# Patient Record
Sex: Male | Born: 1965
Health system: Southern US, Community
[De-identification: ages and names within clinical notes are randomized; demographics above are authoritative.]

## PROBLEM LIST (undated history)

## (undated) DIAGNOSIS — E785 Hyperlipidemia, unspecified: Secondary | ICD-10-CM

## (undated) DIAGNOSIS — H9190 Unspecified hearing loss, unspecified ear: Secondary | ICD-10-CM

## (undated) DIAGNOSIS — I10 Essential (primary) hypertension: Secondary | ICD-10-CM

## (undated) HISTORY — DX: Essential (primary) hypertension: I10

## (undated) HISTORY — DX: Unspecified hearing loss, unspecified ear: H91.90

## (undated) HISTORY — PX: KNEE CARTILAGE SURGERY: SHX688

## (undated) HISTORY — DX: Hyperlipidemia, unspecified: E78.5

---

## 2016-12-17 ENCOUNTER — Encounter: Payer: Self-pay | Admitting: Pediatrics

## 2016-12-17 ENCOUNTER — Encounter (INDEPENDENT_AMBULATORY_CARE_PROVIDER_SITE_OTHER): Payer: Self-pay

## 2016-12-17 ENCOUNTER — Ambulatory Visit (INDEPENDENT_AMBULATORY_CARE_PROVIDER_SITE_OTHER): Payer: PRIVATE HEALTH INSURANCE | Admitting: Pediatrics

## 2016-12-17 VITALS — BP 156/104 | HR 92 | Temp 97.9°F | Ht 73.0 in | Wt 227.2 lb

## 2016-12-17 DIAGNOSIS — Z13220 Encounter for screening for lipoid disorders: Secondary | ICD-10-CM | POA: Diagnosis not present

## 2016-12-17 DIAGNOSIS — Z125 Encounter for screening for malignant neoplasm of prostate: Secondary | ICD-10-CM | POA: Diagnosis not present

## 2016-12-17 DIAGNOSIS — I10 Essential (primary) hypertension: Secondary | ICD-10-CM | POA: Diagnosis not present

## 2016-12-17 DIAGNOSIS — Z6829 Body mass index (BMI) 29.0-29.9, adult: Secondary | ICD-10-CM | POA: Diagnosis not present

## 2016-12-17 DIAGNOSIS — Z7289 Other problems related to lifestyle: Secondary | ICD-10-CM | POA: Insufficient documentation

## 2016-12-17 DIAGNOSIS — Z789 Other specified health status: Secondary | ICD-10-CM

## 2016-12-17 DIAGNOSIS — Z6828 Body mass index (BMI) 28.0-28.9, adult: Secondary | ICD-10-CM

## 2016-12-17 DIAGNOSIS — E669 Obesity, unspecified: Secondary | ICD-10-CM | POA: Insufficient documentation

## 2016-12-17 MED ORDER — LISINOPRIL 10 MG PO TABS: 10.0000 mg | ORAL_TABLET | Freq: Every day | ORAL | 3 refills | Status: DC

## 2016-12-17 NOTE — Progress Notes (Signed)
  Subjective:   Patient ID: Jacob Wood, male    DOB: [birth year], 51 y.o.   MRN: [id number] CC: New Patient (Initial Visit) and Ear Pain (Left)  HPI: Jacob Wood is a 51 y.o. male presenting for New Patient (Initial Visit) and Ear Pain (Left)  Past month has had whooshing in his L ear Feeling well  Normal appetite No CP, SOB, lightheadedness Work has been stressful past month Walks about 5 miles a day working Otherwise no aerobic exercise Drinks 12 pack of beer every night Starts after work Last time he quit alcohol completley was apprx 10 yrs ago Says he has gone a week or so without alcohol here and there without withdrawal Denies any h/o withdrawal symptoms  Dad had prostate cancer in late 29s he thinks Pt concerned abou this risk of cancer  No fam hx of heart attacks, strokes, colon ca   PMH: none  Fam hx: prostate ca in dad  Social History   Social History  . Marital status: Married    Spouse name: N/A  . Number of children: N/A  . Years of education: N/A   Social History Main Topics  . Smoking status: Never Smoker  . Smokeless tobacco: Never Used  . Alcohol use 50.4 oz/week    84 Cans of beer per week  . Drug use: No  . Sexual activity: Not Asked   Other Topics Concern  . None   Social History Narrative  . None   ROS: All systems negative other than what is in HPI  Objective:    BP (!) 156/104   Pulse 92   Temp 97.9 F (36.6 C) (Oral)   Ht 6' 1" (1.854 m)   Wt 227 lb 3.2 oz (103.1 kg)   BMI 29.98 kg/m   Wt Readings from Last 3 Encounters:  12/17/16 227 lb 3.2 oz (103.1 kg)    Gen: NAD, alert, cooperative with exam, NCAT EYES: EOMI, no conjunctival injection, or no icterus ENT:  TMs pearly gray b/l, OP without erythema LYMPH: no cervical LAD CV: NRRR, normal S1/S2, no murmur, distal pulses 2+ b/l Resp: CTABL, no wheezes, normal WOB Abd: +BS, soft, NTND. no guarding or organomegaly Ext: No edema, warm Neuro: Alert and  oriented  Assessment & Plan:  Jacob Wood was seen today for new patient (initial visit) and ear pain.  Diagnoses and all orders for this visit:  Essential hypertension Elevated Start below, check labs Check BPs at home Decrease salt intake, increase veg intake RTC 4 weeks If ear sounds getting worse let m eknow -     CMP14+EGFR -     lisinopril (PRINIVIL,ZESTRIL) 10 MG tablet; Take 1 tablet (10 mg total) by mouth daily.  Screening for prostate cancer Dad with prostate ca -     PSA, total and free  Screening for hyperlipidemia -     Lipid panel  BMI 29.0-29.9,adult Discussed lifestyle changes, decrease beer intake -     CBC with Differential/Platelet  Alcohol use No history of withdrawal Drinking a 12pk a day of beer Counseled on decreasing  Follow up plan: Return in about 4 weeks (around 01/14/2017). Assunta Found, MD [place]

## 2016-12-18 LAB — CBC WITH DIFFERENTIAL/PLATELET
BASOS ABS: 0 10*3/uL (ref 0.0–0.2)
Basos: 0 %
EOS (ABSOLUTE): 0.1 10*3/uL (ref 0.0–0.4)
Eos: 2 %
Hematocrit: 41.2 % (ref 37.5–51.0)
Hemoglobin: 14 g/dL (ref 13.0–17.7)
IMMATURE GRANS (ABS): 0 10*3/uL (ref 0.0–0.1)
IMMATURE GRANULOCYTES: 0 %
LYMPHS: 23 %
Lymphocytes Absolute: 1.7 10*3/uL (ref 0.7–3.1)
MCH: 31.1 pg (ref 26.6–33.0)
MCHC: 34 g/dL (ref 31.5–35.7)
MCV: 92 fL (ref 79–97)
MONOS ABS: 0.5 10*3/uL (ref 0.1–0.9)
Monocytes: 7 %
NEUTROS PCT: 68 %
Neutrophils Absolute: 5.1 10*3/uL (ref 1.4–7.0)
PLATELETS: 187 10*3/uL (ref 150–379)
RBC: 4.5 x10E6/uL (ref 4.14–5.80)
RDW: 14.1 % (ref 12.3–15.4)
WBC: 7.5 10*3/uL (ref 3.4–10.8)

## 2016-12-18 LAB — CMP14+EGFR
A/G RATIO: 1.9 (ref 1.2–2.2)
ALK PHOS: 62 IU/L (ref 39–117)
ALT: 29 IU/L (ref 0–44)
AST: 23 IU/L (ref 0–40)
Albumin: 4.9 g/dL (ref 3.5–5.5)
BILIRUBIN TOTAL: 0.3 mg/dL (ref 0.0–1.2)
BUN/Creatinine Ratio: 20 (ref 9–20)
BUN: 17 mg/dL (ref 6–24)
CHLORIDE: 100 mmol/L (ref 96–106)
CO2: 22 mmol/L (ref 18–29)
Calcium: 9.4 mg/dL (ref 8.7–10.2)
Creatinine, Ser: 0.87 mg/dL (ref 0.76–1.27)
GFR calc Af Amer: 118 mL/min/{1.73_m2} (ref >59)
GFR calc non Af Amer: 102 mL/min/{1.73_m2} (ref >59)
Globulin, Total: 2.6 g/dL (ref 1.5–4.5)
Glucose: 97 mg/dL (ref 65–99)
POTASSIUM: 4.5 mmol/L (ref 3.5–5.2)
Sodium: 140 mmol/L (ref 134–144)
Total Protein: 7.5 g/dL (ref 6.0–8.5)

## 2016-12-18 LAB — PSA, TOTAL AND FREE
PROSTATE SPECIFIC AG, SERUM: 0.7 ng/mL (ref 0.0–4.0)
PSA FREE PCT: 25.7 %
PSA, Free: 0.18 ng/mL

## 2016-12-18 LAB — LIPID PANEL
Chol/HDL Ratio: 5.4 ratio units — ABNORMAL HIGH (ref 0.0–5.0)
Cholesterol, Total: 282 mg/dL — ABNORMAL HIGH (ref 100–199)
HDL: 52 mg/dL (ref >39)
LDL CALC: 182 mg/dL — AB (ref 0–99)
TRIGLYCERIDES: 241 mg/dL — AB (ref 0–149)
VLDL CHOLESTEROL CAL: 48 mg/dL — AB (ref 5–40)

## 2016-12-28 ENCOUNTER — Encounter: Payer: Self-pay | Admitting: *Deleted

## 2016-12-30 ENCOUNTER — Other Ambulatory Visit: Payer: Self-pay | Admitting: Pediatrics

## 2016-12-30 DIAGNOSIS — E785 Hyperlipidemia, unspecified: Secondary | ICD-10-CM

## 2016-12-30 MED ORDER — PRAVASTATIN SODIUM 40 MG PO TABS: 40.0000 mg | ORAL_TABLET | Freq: Every day | ORAL | 5 refills | Status: DC

## 2017-01-17 ENCOUNTER — Ambulatory Visit: Payer: PRIVATE HEALTH INSURANCE | Admitting: Pediatrics

## 2017-01-19 ENCOUNTER — Ambulatory Visit (INDEPENDENT_AMBULATORY_CARE_PROVIDER_SITE_OTHER): Payer: PRIVATE HEALTH INSURANCE | Admitting: Pediatrics

## 2017-01-19 ENCOUNTER — Encounter: Payer: Self-pay | Admitting: Pediatrics

## 2017-01-19 VITALS — BP 135/86 | HR 75 | Temp 98.1°F | Ht 73.0 in | Wt 225.4 lb

## 2017-01-19 DIAGNOSIS — Z7289 Other problems related to lifestyle: Secondary | ICD-10-CM

## 2017-01-19 DIAGNOSIS — H9312 Tinnitus, left ear: Secondary | ICD-10-CM | POA: Diagnosis not present

## 2017-01-19 DIAGNOSIS — Z6829 Body mass index (BMI) 29.0-29.9, adult: Secondary | ICD-10-CM | POA: Diagnosis not present

## 2017-01-19 DIAGNOSIS — I10 Essential (primary) hypertension: Secondary | ICD-10-CM

## 2017-01-19 DIAGNOSIS — Z789 Other specified health status: Secondary | ICD-10-CM | POA: Diagnosis not present

## 2017-01-19 NOTE — Patient Instructions (Signed)
Goal blood pressures 120s/70s

## 2017-01-19 NOTE — Progress Notes (Signed)
  Subjective:   Patient ID: Jacob Wood, male    DOB: 1966/05/21, 51 y.o.   MRN: [id number] CC: Follow-up and Trouble hearing (Left ear)  HPI: Jacob Wood is a 51 y.o. male presenting for Follow-up and Trouble hearing (Left ear)  hasnt been checking BP at home Down to 24 beers a week now, was drinking 12 beers a day Riding mountain bike 30 minutes every day Running stairs at work Has been enjoying change in lifestyle  Continues to have constant whooshing sound in L ear  Relevant past medical, surgical, family and social history reviewed. Allergies and medications reviewed and updated. History  Smoking Status  . Never Smoker  Smokeless Tobacco  . Never Used   ROS: Per HPI   Objective:    BP 135/86   Pulse 75   Temp 98.1 F (36.7 C) (Oral)   Ht 6\' 1"  (1.854 m)   Wt 225 lb 6.4 oz (102.2 kg)   BMI 29.74 kg/m   Wt Readings from Last 3 Encounters:  01/19/17 225 lb 6.4 oz (102.2 kg)  12/17/16 227 lb 3.2 oz (103.1 kg)    Gen: NAD, alert, cooperative with exam, NCAT EYES: EOMI, no conjunctival injection, or no icterus ENT:  TMs pearly gray b/l, OP without erythema LYMPH: no cervical LAD CV: NRRR, normal S1/S2, no murmur, distal pulses 2+ b/l Resp: CTABL, no wheezes, normal WOB Abd: +BS, soft, NTND. no guarding or organomegaly Ext: No edema, warm Neuro: Alert and oriented  Assessment & Plan:  Jacob Wood was seen today for follow-up and trouble hearing.  Diagnoses and all orders for this visit:  Tinnitus of left ear Ongoing symptoms -     Ambulatory referral to ENT  Essential hypertension Improved from last visit Not on medications Changing lifestyle habits  Alcohol use Decreasing EtOH use  Elevated BMI Exercising more regularly, decreasing EtOH, trying to eat more healthy foods  Follow up plan: Return in about 3 months (around 04/21/2017). Assunta Found, MD [place]

## 2017-02-14 DIAGNOSIS — H905 Unspecified sensorineural hearing loss: Secondary | ICD-10-CM | POA: Insufficient documentation

## 2017-02-16 ENCOUNTER — Other Ambulatory Visit: Payer: Self-pay | Admitting: Otolaryngology

## 2017-02-16 DIAGNOSIS — H9042 Sensorineural hearing loss, unilateral, left ear, with unrestricted hearing on the contralateral side: Secondary | ICD-10-CM

## 2017-02-16 DIAGNOSIS — H93A2 Pulsatile tinnitus, left ear: Secondary | ICD-10-CM

## 2017-03-04 ENCOUNTER — Other Ambulatory Visit: Payer: Self-pay

## 2017-03-04 ENCOUNTER — Ambulatory Visit
Admission: RE | Admit: 2017-03-04 | Discharge: 2017-03-04 | Disposition: A | Discharge status: Home / Self Care | Payer: PRIVATE HEALTH INSURANCE | Source: Ambulatory Visit | Attending: Otolaryngology | Admitting: Otolaryngology

## 2017-03-04 DIAGNOSIS — H93A2 Pulsatile tinnitus, left ear: Secondary | ICD-10-CM

## 2017-03-04 DIAGNOSIS — H9042 Sensorineural hearing loss, unilateral, left ear, with unrestricted hearing on the contralateral side: Secondary | ICD-10-CM

## 2017-03-04 MED ORDER — GADOBENATE DIMEGLUMINE 529 MG/ML IV SOLN
20.0000 mL | Freq: Once | INTRAVENOUS | Status: AC | PRN
  Administered 2017-03-04: 20 mL via INTRAVENOUS

## 2017-03-14 ENCOUNTER — Ambulatory Visit: Payer: PRIVATE HEALTH INSURANCE | Admitting: Family Medicine

## 2017-03-15 ENCOUNTER — Encounter: Payer: Self-pay | Admitting: Family Medicine

## 2017-03-15 ENCOUNTER — Ambulatory Visit (INDEPENDENT_AMBULATORY_CARE_PROVIDER_SITE_OTHER): Payer: PRIVATE HEALTH INSURANCE | Admitting: Family Medicine

## 2017-03-15 VITALS — BP 137/93 | HR 74 | Temp 97.8°F | Ht 73.0 in | Wt 222.8 lb

## 2017-03-15 DIAGNOSIS — S8990XA Unspecified injury of unspecified lower leg, initial encounter: Secondary | ICD-10-CM | POA: Diagnosis not present

## 2017-03-15 NOTE — Patient Instructions (Signed)
Great to see you!  We will work on a referral to sports medicine.   For now use 2 aleve twice daily ( no more than 2 weeks straight), Ice, and compression with an ace bandage.

## 2017-03-15 NOTE — Progress Notes (Signed)
   HPI  Patient presents today with left calf pain.  Patient explains that 4 days ago he was walking a chair back from his yard going into the garage when he felt like "a hammer hit him in the back of the calf". He states that he had something like a muscle spasm at the time that relaxed after several hours of heat and massage. The following day he continued to have the same sort of muscle spasm.  Today he continues to have similar pain. He's been treating it with an Ace bandage and heat.  He's had a difficult time working Monday morning due to difficulty and pain with walking.  PMH: Smoking status noted ROS: Per HPI  Objective: BP (!) 137/93   Pulse 74   Temp 97.8 F (36.6 C) (Oral)   Ht 6\' 1"  (1.854 m)   Wt 222 lb 12.8 oz (101.1 kg)   BMI 29.39 kg/m  Gen: NAD, alert, cooperative with exam HEENT: NCAT CV: RRR, good S1/S2, no murmur Resp: CTABL, no wheezes, non-labored Ext: No edema, warm Neuro: Alert and oriented, No gross deficits MSK:  tenderness to palpation on the medial belly of the gastroc/soleus on the left Achilles tendon feels to be intact, flexion of the left foot with full strength  Assessment and plan:  # Calf injury Considering his injury and persistent pain after 4 days I am concerned about at least a partial muscle tear or tendon injury to the soleus or medial gastroc Considering difficulty walking and working have referred him to sports medicine to consider muscles that'll ultrasound to see if discrete injury can be identified. His tendon feels to be intact but don't believe he will need surgical correction. Conservative therapy with scheduled NSAIDs, ice, compression 2-3 days out of work.     Orders Placed This Encounter  Procedures  . Ambulatory referral to Sports Medicine    Referral Priority:   Routine    Referral Type:   Consultation    Number of Visits Requested:   [place], MD [place] 03/15/2017, 8:37  AM

## 2017-03-16 ENCOUNTER — Ambulatory Visit (INDEPENDENT_AMBULATORY_CARE_PROVIDER_SITE_OTHER): Payer: PRIVATE HEALTH INSURANCE | Admitting: Family Medicine

## 2017-03-16 ENCOUNTER — Encounter: Payer: Self-pay | Admitting: Family Medicine

## 2017-03-16 DIAGNOSIS — M79662 Pain in left lower leg: Secondary | ICD-10-CM | POA: Diagnosis not present

## 2017-03-16 MED ORDER — MELOXICAM 15 MG PO TABS: 15.0000 mg | ORAL_TABLET | Freq: Every day | ORAL | 0 refills | Status: DC

## 2017-03-16 NOTE — Progress Notes (Signed)
  Jacob Wood - 51 y.o. male MRN [id number]  Date of birth: [birth year]  SUBJECTIVE:  Including CC & ROS.   Jacob Wood is a 51 year old male that is presenting with left calf pain. He was getting something out of his garage and went to turn and carry the chair out of the garage and felt a pop in his calf. He had instant swelling. The pain has been moderate and continued since the injury occurred. He has tried heat and ice. He has pain on the medial gastroc. He has taken Aleve. He denies to prior injury.  ROS: No unexpected weight loss, fever, chills,  instability, numbness/tingling, redness, otherwise see HPI   HISTORY: Past Medical, Surgical, Social, and Family History Reviewed & Updated per EMR.   Pertinent Historical Findings include: PMHx: None Surgical:   Left knee arthroscopy Social:  Uses Chewing tobacco FHx: None  DATA REVIEWED: None  PHYSICAL EXAM:  VS: BP 129/88   Ht 6\' 1"  (1.854 m)   Wt 222 lb (100.7 kg)   BMI 29.29 kg/m  PHYSICAL EXAM: Gen: NAD, alert, cooperative with exam, well-appearing HEENT: clear conjunctiva, EOMI CV:  no edema, capillary refill brisk,  Resp: non-labored, normal speech Skin: no rashes, normal turgor  Neuro: no gross deficits.  Psych:  alert and oriented MSK: Left lower leg: Obvious swelling of the leg. No obvious defect of the Achilles Pain exacerbated with plantarflexion. Tenderness to palpation over the medial gastroc. Some tenderness to palpation lateral gastroc. No pain with dorsiflexion. Normal knee flexion and extension. Plantarflexion with [place] Silos test Neurovascular intact.  Limited ultrasound: left leg:  Achilles intact and normal Large hematoma of the medial gastroc just superior to the soleus that extends from the musculotendinous junction to the proximal head of the medial gastrocnemius. Lateral gastrocnemius and intersection were normal  Summary: Findings are consistent with a large medial gastrocnemius tear and  hematoma development  Ultrasound and interpretation by Clearance Coots, MD   ASSESSMENT & PLAN:   Pain of left calf He has a severe tear of medial gastrocnemius. Large hematoma development on ultrasound. - Ace wrap and Cam Walker provided today. - Mobic sent. If doesn't control his pain may need to send in tramadol. - He will follow-up in one week to monitor the size of the hematoma with ultrasound and also to monitor for any development of a blood clot.

## 2017-03-17 DIAGNOSIS — M79662 Pain in left lower leg: Secondary | ICD-10-CM | POA: Insufficient documentation

## 2017-03-17 NOTE — Assessment & Plan Note (Addendum)
He has a severe tear of medial gastrocnemius. Large hematoma development on ultrasound. - Ace wrap and Cam Walker provided today. - Mobic sent. If doesn't control his pain may need to send in tramadol. - He will follow-up in one week to monitor the size of the hematoma with ultrasound and also to monitor for any development of a blood clot.

## 2017-03-23 ENCOUNTER — Encounter: Payer: Self-pay | Admitting: Student

## 2017-03-23 ENCOUNTER — Ambulatory Visit (INDEPENDENT_AMBULATORY_CARE_PROVIDER_SITE_OTHER): Payer: PRIVATE HEALTH INSURANCE | Admitting: Student

## 2017-03-23 VITALS — BP 142/86 | Ht 73.0 in | Wt 222.0 lb

## 2017-03-23 DIAGNOSIS — M79662 Pain in left lower leg: Secondary | ICD-10-CM

## 2017-03-23 NOTE — Assessment & Plan Note (Signed)
Pain and swelling much improvement is seen on ultrasound. Gave calf sleeve to help with pain. Recommend wearing a boot while walking long distances. Follow-up in 2 weeks to reassess and potentially start increasing exercises.

## 2017-03-23 NOTE — Progress Notes (Signed)
  Jacob Wood - 51 y.o. male MRN [id number]  Date of birth: 07-24-1966  SUBJECTIVE:  Including CC & ROS.  CC: left calf pain  Presents with 1 week follow-up of a left medial gastroc tear. This was seen on ultrasound last week. He was placed in a boot to help with weightbearing. 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 He reports much improved pain and motion. He has been doing light ROM exercises with improvement of pain.   ROS: No unexpected weight loss, fever, chills, swelling, instability, muscle pain, numbness/tingling, redness, otherwise see HPI   PMHx - Updated and reviewed.  Contributory factors include: HLD PSHx - Updated and reviewed.  Contributory factors include:  Negative FHx - Updated and reviewed.  Contributory factors include:  Negative Social Hx - Updated and reviewed. Contributory factors include: Negative Medications - reviewed   DATA REVIEWED: Previous u/s last week- medial gastroc tear  PHYSICAL EXAM:  VS: BP:(!) 142/86  HR: bpm  TEMP: ( )  RESP:   HT:6\' 1"  (185.4 cm)   WT:222 lb (100.7 kg)  BMI:29.4 PHYSICAL EXAM: Gen: NAD, alert, cooperative with exam, well-appearing HEENT: clear conjunctiva,  CV:  no edema, capillary refill brisk, normal rate Resp: non-labored Skin: no rashes, normal turgor  Neuro: no gross deficits.  Psych:  alert and oriented  Ankle & Foot: No visible ecchymosis, erythema, ulcers, calluses, blister.  Mild swelling of left calf compared to right Achilles tendon without nodules or tenderness No swelling of retrocalcaneal bursa Mildly decreased in plantarflexion, dorsiflexion, inversion, and eversion of the foot; flexion and extension of the toes Sensation: intact Vascular: intact w/ dorsalis pedis & posterior tibialis pulses 2+  Limited ultrasound of left calf:  Left Achilles without any tearing and has good fibrillary pattern Left retrocalcaneal bursitis Medial gastrocnemius with hematoma superior to soleus at the  musculotendinous junction, smaller than last visit  Summary: Findings consistent with an improved medial gastrocnemius tear and hematoma development  Ultrasound performed and interpreted by Melton Krebs, DO   ASSESSMENT & PLAN:   Pain of left calf Pain and swelling much improvement is seen on ultrasound. Gave calf sleeve to help with pain. Recommend wearing a boot while walking long distances. Follow-up in 2 weeks to reassess and potentially start increasing exercises.

## 2017-04-06 ENCOUNTER — Ambulatory Visit (INDEPENDENT_AMBULATORY_CARE_PROVIDER_SITE_OTHER): Payer: PRIVATE HEALTH INSURANCE | Admitting: Student

## 2017-04-06 VITALS — BP 128/80

## 2017-04-06 DIAGNOSIS — M79662 Pain in left lower leg: Secondary | ICD-10-CM | POA: Diagnosis not present

## 2017-04-06 NOTE — Progress Notes (Signed)
  Jacob Wood - 51 y.o. male MRN [id number]  Date of birth: [birth year]  SUBJECTIVE:  Including CC & ROS.  CC: left calf pain  Follows up for tear of the medial gastrocnemius muscle on his left side. He is doing much better since his last visit 2 weeks ago. He had done a partial tear to this muscle while changing directions while running.  He has been doing gentle range of motion exercises with much improvement. He has even been able to bike without pain. He did have a mild amount of pain with jogging up stairs. He tried these exercises yesterday. He has used his calf sleeve and has had much relief with this.   ROS: No unexpected weight loss, fever, chills, swelling, instability, muscle pain, numbness/tingling, redness, otherwise see HPI   PMHx - Updated and reviewed.  Contributory factors include: HTN, alcohol use PSHx - Updated and reviewed.  Contributory factors include:  Negative FHx - Updated and reviewed.  Contributory factors include:  Negative Social Hx - Updated and reviewed. Contributory factors include: Negative Medications - reviewed   DATA REVIEWED: Previous office visits  PHYSICAL EXAM:  VS: BP:128/80  HR: bpm  TEMP: ( )  RESP:   HT:    WT:   BMI:  PHYSICAL EXAM: Gen: NAD, alert, cooperative with exam, well-appearing HEENT: clear conjunctiva,  CV:  no edema, capillary refill brisk, normal rate Resp: non-labored Skin: no rashes, normal turgor  Neuro: no gross deficits.  Psych:  alert and oriented  Isolation of left calf reveals no tenderness palpation. On inspection no visible increase in size of the left calf compared to the right. Full muscle strength of his bilateral lower extremities Neurovascularly intact distally in his bilateral lower extremities  ASSESSMENT & PLAN:   Pain of left calf Much improved from last visit. Recommend strengthening exercises. Gave him a sheet of exercises to do, including toe and heel walks. Also gave ankle strengthening  exercises with a band. Follow-up as needed. Recommended walking up and down stairs at first for 2 weeks. After that he can start jogging for 2 weeks. After that time he can start running. Did counsel that straining the calf can seem as though it is healed 1 not early in the healing process. Recommended a slow return to activity.

## 2017-04-06 NOTE — Assessment & Plan Note (Signed)
Much improved from last visit. Recommend strengthening exercises. Gave him a sheet of exercises to do, including toe and heel walks. Also gave ankle strengthening exercises with a band. Follow-up as needed. Recommended walking up and down stairs at first for 2 weeks. After that he can start jogging for 2 weeks. After that time he can start running. Did counsel that straining the calf can seem as though it is healed 1 not early in the healing process. Recommended a slow return to activity.

## 2017-04-22 ENCOUNTER — Encounter: Payer: Self-pay | Admitting: Pediatrics

## 2017-04-22 ENCOUNTER — Ambulatory Visit (INDEPENDENT_AMBULATORY_CARE_PROVIDER_SITE_OTHER): Payer: PRIVATE HEALTH INSURANCE | Admitting: Pediatrics

## 2017-04-22 VITALS — BP 132/85 | HR 85 | Temp 98.2°F | Ht 73.0 in | Wt 218.2 lb

## 2017-04-22 DIAGNOSIS — I10 Essential (primary) hypertension: Secondary | ICD-10-CM

## 2017-04-22 DIAGNOSIS — H9312 Tinnitus, left ear: Secondary | ICD-10-CM | POA: Diagnosis not present

## 2017-04-22 DIAGNOSIS — E785 Hyperlipidemia, unspecified: Secondary | ICD-10-CM

## 2017-04-22 DIAGNOSIS — Z789 Other specified health status: Secondary | ICD-10-CM

## 2017-04-22 DIAGNOSIS — Z7289 Other problems related to lifestyle: Secondary | ICD-10-CM

## 2017-04-22 MED ORDER — PRAVASTATIN SODIUM 40 MG PO TABS: 40.0000 mg | ORAL_TABLET | Freq: Every day | ORAL | 2 refills | Status: DC

## 2017-04-22 NOTE — Progress Notes (Signed)
  Subjective:   Patient ID: Jacob Wood, male    DOB: [birth year], 51 y.o.   MRN: [id number] CC: Follow-up multiple med problems HPI: Jacob Wood is a 51 y.o. male presenting for Follow-up  Ear symptoms with whooshing sound the same Doesn't come and go There all the time Has been seen by ENT, normal MRI Has f/u with them next month  EtOH use: drinking 12 pack Friday and Saturday Down from 12 pack daily Open to cutting back more  Had a tear of gastrocnemius muscle about a month ago Has been cleared to start increasing activity which he is looking forward to Plans to return to biking/other activities slowly  HTN: no HA, no cp with exertion Never started BP medicine prescribed BP now improved, has had apprx 10 lbs weight loss [facility]   HLD: taking pravastatin daily, no s/e  Relevant past medical, surgical, family and social history reviewed. Allergies and medications reviewed and updated. History  Smoking Status  . Never Smoker  Smokeless Tobacco  . Never Used   ROS: Per HPI   Objective:    BP 132/85   Pulse 85   Temp 98.2 F (36.8 C) (Oral)   Ht 6\' 1"  (1.854 m)   Wt 218 lb 3.2 oz (99 kg)   BMI 28.79 kg/m   Wt Readings from Last 3 Encounters:  04/22/17 218 lb 3.2 oz (99 kg)  03/23/17 222 lb (100.7 kg)  03/16/17 222 lb (100.7 kg)    Gen: NAD, alert, cooperative with exam, NCAT EYES: EOMI, no conjunctival injection, or no icterus ENT:  TMs gray with nl LR b/l, OP without erythema LYMPH: no cervical LAD CV: NRRR, normal S1/S2, no murmur Resp: CTABL, no wheezes, normal WOB Abd: +BS, soft, NTND. no guarding or organomegaly Ext: No edema, warm Neuro: Alert and oriented, strength equal b/l UE and LE, coordination grossly normal  Assessment & Plan:  Jacob Wood was seen today for follow-up.  Diagnoses and all orders for this visit:  Alcohol use Decreasing amount, open to decreasing more No withdrawal symptoms  Hyperlipidemia, unspecified  hyperlipidemia type Cont below -     pravastatin (PRAVACHOL) 40 MG tablet; Take 1 tablet (40 mg total) by mouth daily.  Essential hypertension BP improved, not on meds now, has had apprx 10 lbs weight loss, cont lifestyle changes [facility], let m eknow if regularly elevated, will need to start med  Tinnitus of left ear Normal MRI with ENT, has f/u appt next week  Follow up plan: Return in about 6 months (around 10/22/2017). Assunta Found, MD [place]

## 2017-07-22 ENCOUNTER — Encounter: Payer: Self-pay | Admitting: *Deleted

## 2017-08-08 ENCOUNTER — Encounter: Payer: Self-pay | Admitting: Family Medicine

## 2017-08-08 ENCOUNTER — Ambulatory Visit (INDEPENDENT_AMBULATORY_CARE_PROVIDER_SITE_OTHER): Payer: PRIVATE HEALTH INSURANCE | Admitting: Family Medicine

## 2017-08-08 VITALS — BP 143/99 | HR 99 | Temp 98.4°F | Ht 73.0 in | Wt 216.0 lb

## 2017-08-08 DIAGNOSIS — I10 Essential (primary) hypertension: Secondary | ICD-10-CM | POA: Diagnosis not present

## 2017-08-08 DIAGNOSIS — M25512 Pain in left shoulder: Secondary | ICD-10-CM

## 2017-08-08 MED ORDER — KETOROLAC TROMETHAMINE 30 MG/ML IJ SOLN
30.0000 mg | Freq: Once | INTRAMUSCULAR | Status: AC
  Administered 2017-08-08: 30 mg via INTRAMUSCULAR

## 2017-08-08 MED ORDER — MELOXICAM 15 MG PO TABS
15.0000 mg | ORAL_TABLET | Freq: Every day | ORAL | 0 refills | Status: DC
Start: 2017-08-08 — End: 2017-10-20

## 2017-08-08 NOTE — Patient Instructions (Signed)
I think that you likely have something going on in your rotator cuff. I have placed a referral to sports [place] for ultrasound of your shoulder. You were given a dose of Toradol today. This should help with pain and inflammation. I have also prescribed meloxicam 15 mg free to take daily with a meal. Do not take any Motrin/ ibuprofen, naproxen/ Aleve while taking this medicine. You may start the meloxicam tomorrow.   Rotator Cuff Tendinitis Rotator cuff tendinitis is inflammation of the tough, cord-like bands that connect muscle to bone (tendons) in the rotator cuff. The rotator cuff includes all of the muscles and tendons that connect the arm to the shoulder. The rotator cuff holds the head of the upper arm bone (humerus) in the cup (fossa) of the shoulder blade (scapula). This condition can lead to a long-lasting (chronic) tear. The tear may be partial or complete. What are the causes? This condition is usually caused by overusing the rotator cuff. What increases the risk? This condition is more likely to develop in athletes and workers who frequently use their shoulder or reach over their heads. This can include activities such as:  Tennis.  Baseball or softball.  Swimming.  Construction work.  Painting.  What are the signs or symptoms? Symptoms of this condition include:  Pain spreading (radiating) from the shoulder to the upper arm.  Swelling and tenderness in front of the shoulder.  Pain when reaching, pulling, or lifting the arm above the head.  Pain when lowering the arm from above the head.  Minor pain in the shoulder when resting.  Increased pain in the shoulder at night.  Difficulty placing the arm behind the back.  How is this diagnosed? This condition is diagnosed with a medical history and physical exam. Tests may also be done, including:  X-rays.  MRI.  Ultrasounds.  CT or MR arthrogram. During this test, a contrast material is injected and then  images are taken.  How is this treated? Treatment for this condition depends on the severity of the condition. In less severe cases, treatment may include:  Rest. This may be done with a sling that holds the shoulder still (immobilization). Your health care provider may also recommend avoiding activities that involve lifting your arm over your head.  Icing the shoulder.  Anti-inflammatory medicines, such as aspirin or ibuprofen.  In more severe cases, treatment may include:  Physical therapy.  Steroid injections.  Surgery.  Follow these instructions at home: If you have a sling:  Wear the sling as told by your health care provider. Remove it only as told by your health care provider.  Loosen the sling if your fingers tingle, become numb, or turn cold and blue.  Keep the sling clean.  If the sling is not waterproof, do not let it get wet. Remove it, if allowed, or cover it with a watertight covering when you take a bath or shower. Managing pain, stiffness, and swelling  If directed, put ice on the injured area. ? If you have a removable sling, remove it as told by your health care provider. ? Put ice in a plastic bag. ? Place a towel between your skin and the bag. ? Leave the ice on for 20 minutes, 2-3 times a day.  Move your fingers often to avoid stiffness and to lessen swelling.  Raise (elevate) the injured area above the level of your heart while you are lying down.  Find a comfortable sleeping position or sleep on a  recliner, if available. Driving  Do not drive or use heavy machinery while taking prescription pain medicine.  Ask your health care provider when it is safe to drive if you have a sling on your arm. Activity  Rest your shoulder as told by your health care provider.  Return to your normal activities as told by your health care provider. Ask your health care provider what activities are safe for you.  Do any exercises or stretches as told by your  health care provider.  If you do repetitive overhead tasks, take small breaks in between and include stretching exercises as told by your health care provider. General instructions  Do not use any products that contain nicotine or tobacco, such as cigarettes and e-cigarettes. These can delay healing. If you need help quitting, ask your health care provider.  Take over-the-counter and prescription medicines only as told by your health care provider.  Keep all follow-up visits as told by your health care provider. This is important. Contact a health care provider if:  Your pain gets worse.  You have new pain in your arm, hands, or fingers.  Your pain is not relieved with medicine or does not get better after 6 weeks of treatment.  You have cracking sensations when moving your shoulder in certain directions.  You hear a snapping sound after using your shoulder, followed by severe pain and weakness. Get help right away if:  Your arm, hand, or fingers are numb or tingling.  Your arm, hand, or fingers are swollen or painful or they turn white or blue. Summary  Rotator cuff tendinitis is inflammation of the tough, cord-like bands that connect muscle to bone (tendons) in the rotator cuff.  This condition is usually caused by overusing the rotator cuff, which includes all of the muscles and tendons that connect the arm to the shoulder.  This condition is more likely to develop in athletes and workers who frequently use their shoulder or reach over their heads.  Treatment generally includes rest, anti-inflammatory medicines, and icing. In some cases, physical therapy and steroid injections may be needed. In severe cases, surgery may be needed. This information is not intended to replace advice given to you by your health care provider. Make sure you discuss any questions you have with your health care provider. Document Released: 01/08/2004 Document Revised: 10/04/2016 Document Reviewed:  10/04/2016 Elsevier Interactive Patient Education  2017 [employer].

## 2017-08-08 NOTE — Progress Notes (Signed)
Subjective: Jacob Wood pain PCP: Eustaquio Maize, MD MVH:QIONGEX Jacob Wood is a 51 y.o. male presenting to clinic today for:  1. Shoulder pain Patient reports acute onset of left shoulder pain on Friday. He reports that initially started to feel sore and stiff. As of yesterday, he was unable to elevate left upper extremity secondary to pain. He reports that he use Aleve without any improvement. His wife had some Tylenol 3 at home which he notes helps pain for about 30 minutes. He is also been applying topical BenGay, using heat and ice to the shoulder. He denies history of shoulder injury or surgeries. He reports that he is very physically active at work, often lifting, pushing and pulling furniture and other items. Denies swelling, discoloration, weakness, numbness/ tingling.  2. Elevated BP Patient notes he has a history of hypertension but is not on medications for this. He has been using Aleve as above. Denies chest pain, shortness of breath, lower extremity swelling.  No Known Allergies Past Medical History:  Diagnosis Date  . Hyperlipidemia   . Hypertension    History reviewed. No pertinent family history. Social Hx: never smoker.Current medications reviewed.    Health Maintenance: Flu Vaccine needed: yes  ROS: Per HPI  Objective: Office vital signs reviewed. BP (!) 143/99   Pulse 99   Temp 98.4 F (36.9 C) (Oral)   Ht 6\' 1"  (1.854 m)   Wt 216 lb (98 kg)   BMI 28.50 kg/m   Physical Examination:  General: Awake, alert, well nourished, No acute distress HEENT: Normal    Eyes: PERRLA, extraocular membranes intact, sclera white    Throat: moist mucus membranes, no erythema, Cardio: regular rate and rhythm, S1S2 heard, no murmurs appreciated Pulm: clear to auscultation bilaterally, no wheezes, rhonchi or rales; normal work of breathing on room air Ext: Warm, well perfused. Has full active range of motion of the right upper extremity.  Left shoulder: He is not able to  aBduct the upper extremity beyond about 20 during active range of motion. I am able to aBduct the shoulder to about 90 and passive range of motion. He has pain beyond this.He is unable to do internal or external active range of motion testing.  No palpable bony abnormalities. He does have tenderness to palpation to the posterior shoulder. He has pain with empty can testing. He has 4/5 strength with resistance to internal rotation and abduction. Neuro: Light touch sensation intact. Follows commands without difficulty.   Assessment/ Plan: 51 y.o. male   1. Acute pain of left shoulder I suspect rotator cuff tendinopathy. There was no acute trauma or inciting event suggest tear. However, I have placed a referral to sports medicine for evaluation under ultrasound. He was given a dose of Toradol in office. I also prescribed meloxicam 15 mg to start daily tomorrow. I advised him to continue this medication for the next 7-10 days then use as needed. Pending evaluation under ultrasound, could consider steroid injection to the left shoulder if clinically appropriate. No red flags on exam. Patient to follow-up as needed here in office. Return precautions were reviewed. - meloxicam (MOBIC) 15 MG tablet; Take 1 tablet (15 mg total) by mouth daily.  Dispense: 30 tablet; Refill: 0 - ketorolac (TORADOL) 30 MG/ML injection 30 mg; Inject 1 mL (30 mg total) into the muscle once. - Ambulatory referral to Sports Medicine  2. Essential hypertension Family diet controlled. I suspect that elevation of blood pressure may be secondary to acute pain in the  left shoulder. However, I advised him to follow-up in the next 2 weeks with his PCP for recheck of blood pressure. If persistently elevated, would initiate oral antihypertensive. No red flags on exam. Follow up as scheduled.   Orders Placed This Encounter  Procedures  . Ambulatory referral to Sports Medicine    Referral Priority:   Routine    Referral Type:    Consultation    Number of Visits Requested:   1   Meds ordered this encounter  Medications  . meloxicam (MOBIC) 15 MG tablet    Sig: Take 1 tablet (15 mg total) by mouth daily.    Dispense:  30 tablet    Refill:  0  . ketorolac (TORADOL) 30 MG/ML injection 30 mg     Janora Norlander, DO [place] [phone]

## 2017-08-16 ENCOUNTER — Ambulatory Visit: Payer: Self-pay

## 2017-08-16 ENCOUNTER — Ambulatory Visit (INDEPENDENT_AMBULATORY_CARE_PROVIDER_SITE_OTHER): Payer: PRIVATE HEALTH INSURANCE | Admitting: Family Medicine

## 2017-08-16 VITALS — BP 140/100 | Ht 73.0 in | Wt 214.0 lb

## 2017-08-16 DIAGNOSIS — M25512 Pain in left shoulder: Secondary | ICD-10-CM | POA: Insufficient documentation

## 2017-08-16 MED ORDER — TRAMADOL HCL 50 MG PO TABS: ORAL_TABLET | ORAL | 0 refills | Status: DC

## 2017-08-16 MED ORDER — METHYLPREDNISOLONE ACETATE 40 MG/ML IJ SUSP
40.0000 mg | Freq: Once | INTRAMUSCULAR | Status: AC
  Administered 2017-08-16: 40 mg via INTRA_ARTICULAR

## 2017-08-16 NOTE — Assessment & Plan Note (Signed)
Unclear what started this. I suspect he has subacromial bursitis. After consultation today we decided to do subacromial bursa injection and follow him up in one to 2 weeks. I did give him small number of tramadol to take at night for pain as she's not been sleeping well secondary to shoulder issues. He is cautioned on use with alcohol and not to use when driving.

## 2017-08-16 NOTE — Progress Notes (Signed)
  Jacob Wood - 51 y.o. male MRN [id number]  Date of birth: Aug 24, 1966    SUBJECTIVE:      Chief Complaint:/ HPI:  Acute onset of left shoulder pain 7 days ago. Had no unusual activity that day or the day before. He works as an Glass blower/designer. He occasionally does more physical work but nothing specific in recent history that would injure his shoulder. Shoulder pain diffuse, 7 out of 10. Worse at night when she's having difficulty sleeping secondary to shoulder pain. He is right-hand dominant. Has occasional tendon tingling in his left long and ring finger but this is only intermittently. Has trouble reaching forward, reaching overhead and to the side area shoulder hurts at rest.   ROS:     No unusual weight change. No fever. Has noted no swelling or erythema of the left shoulder joint. No other unusual arthralgias or myalgias. No skin rash. No fatigue.  PERTINENT  PMH / PSH FH / / SH:  Past Medical, Surgical, Social, and Family History Reviewed & Updated in the EMR.  Pertinent findings include:  HTN History of hyperlipidemia Has not had a surgeries or any specific injuries to his left shoulder area. Never smoker. Drinks several beers per week.   OBJECTIVE: Ht 6\' 1"  (1.854 m)   Wt 214 lb (97.1 kg)   BMI 28.23 kg/m   Physical Exam:  Vital signs are reviewed. GEN.: Well-developed male no acute distress SHOULDERS: Symmetrical. Right shoulder has full range of motion and normal strength in all planes the rotator cuff. Left shoulder has full passive range of motion to 164 flexion 150 and lateral abduction. Internal rotation limited to the can place his hand to the left buttock. He cannot perform lift off test. NEURO: He has intact sensation soft touch bilateral hands.  EXT: He has intact muscle strength C4-5 and 6 bilateral upper extremities.   ULTRASOUND: Left shoulder: Rotator cuff muscles are all seen and intact. There are some small amount of calcifications in the infraspinatus  muscle. The acromioclavicular joint has minimal arthritic changes. There is no significant effusion, no synovial hypertrophy. The bicep tendon is intact and well positioned.  PROCEDURE: INJECTION: Patient was given informed consent, signed copy in the chart. Appropriate time out was taken. Area prepped and draped in usual sterile fashion. Ethyl chloride was  used for local anesthesia. A 21 gauge 1 1/2 inch needle was used.. 1 cc of methylprednisolone 40 mg/ml plus  4 cc of 1% lidocaine without epinephrine was injected into the left subacromial bursa using a(n) posterior approach.   The patient tolerated the procedure well. There were no complications. Post procedure instructions were given.   ASSESSMENT & PLAN:  See problem based charting & AVS for pt instructions.

## 2017-08-16 NOTE — Patient Instructions (Signed)
See me in  A couple of weeks for follow up

## 2017-08-30 ENCOUNTER — Ambulatory Visit (INDEPENDENT_AMBULATORY_CARE_PROVIDER_SITE_OTHER): Payer: PRIVATE HEALTH INSURANCE | Admitting: Family Medicine

## 2017-08-30 DIAGNOSIS — M25512 Pain in left shoulder: Secondary | ICD-10-CM

## 2017-08-30 MED ORDER — PREDNISONE 50 MG PO TABS: ORAL_TABLET | ORAL | 0 refills | Status: DC

## 2017-08-30 MED ORDER — TRAMADOL HCL 50 MG PO TABS: ORAL_TABLET | ORAL | 0 refills | Status: DC

## 2017-08-30 NOTE — Assessment & Plan Note (Signed)
We had discussion about options. He still paying off his last MRI so he doesn't want to pursue that if at all possible. He does not want to consider another injection at this time. We will give him another week prescription for tramadol. We'll put him on a six-day prednisone burst. He's had no improvement with that, then he'll call and we will see him back for reconsideration of injection, subacromial plus minus glenohumeral joint.

## 2017-08-30 NOTE — Progress Notes (Signed)
  Jacob Wood - 51 y.o. male MRN [id number]  Date of birth: [birth year]    SUBJECTIVE:      Chief Complaint:/ HPI:  Follow-up left shoulder pain. At last office visit we did subacromial injection. He had improvement for about 2 days. His symptoms and returned. Nothing different about them at this point. The tramadol does help him sleep some. Pain is worse with certain overhead motions and reaching forward. Nothing helps the pain except the tramadol at night.  ROS:     No fever. No numbness or tingling in his hands.  PERTINENT  PMH / PSH FH / / SH:  Past Medical, Surgical, Social, and Family History Reviewed & Updated in the EMR.  Pertinent findings include:  No personal history diabetes mellitus  OBJECTIVE: BP (!) 132/96   Ht 6\' 1"  (1.854 m)   Wt 214 lb (97.1 kg)   BMI 28.23 kg/m   Physical Exam:  Vital signs are reviewed. GEN.: Well-developed male no acute distress neck some SHOULDERS: Symmetrical. Left shoulder has full range of motion. He has pain with forward flexion and abduction above 90. He also has some pain with internal rotation. Strength in all planes the rotator cuff is intact. Biceps strength is intact. VASCULAR: Radial pulses 2+ bilaterally equal  ASSESSMENT & PLAN:  See problem based charting & AVS for pt instructions.

## 2017-08-30 NOTE — Patient Instructions (Signed)
STOP the meloxicam Start the prednisone daily You can take OTC ibuprofen if you need to but only at OTC dose. See me in a week (call for an appointment) if not better

## 2017-10-11 ENCOUNTER — Other Ambulatory Visit: Payer: Self-pay

## 2017-10-11 ENCOUNTER — Encounter (HOSPITAL_COMMUNITY): Payer: Self-pay | Admitting: Emergency Medicine

## 2017-10-11 ENCOUNTER — Emergency Department (HOSPITAL_COMMUNITY)
Admission: EM | Admit: 2017-10-11 | Discharge: 2017-10-11 | Disposition: A | Discharge status: Home / Self Care | Payer: PRIVATE HEALTH INSURANCE | Attending: Emergency Medicine | Admitting: Emergency Medicine

## 2017-10-11 DIAGNOSIS — K649 Unspecified hemorrhoids: Secondary | ICD-10-CM

## 2017-10-11 DIAGNOSIS — I10 Essential (primary) hypertension: Secondary | ICD-10-CM | POA: Insufficient documentation

## 2017-10-11 DIAGNOSIS — K6289 Other specified diseases of anus and rectum: Secondary | ICD-10-CM | POA: Diagnosis present

## 2017-10-11 LAB — CBC WITH DIFFERENTIAL/PLATELET
BASOS PCT: 0 %
Basophils Absolute: 0 10*3/uL (ref 0.0–0.1)
Eosinophils Absolute: 0.1 10*3/uL (ref 0.0–0.7)
Eosinophils Relative: 1 %
HEMATOCRIT: 47.3 % (ref 39.0–52.0)
Hemoglobin: 15.4 g/dL (ref 13.0–17.0)
Lymphocytes Relative: 21 %
Lymphs Abs: 1.9 10*3/uL (ref 0.7–4.0)
MCH: 30.3 pg (ref 26.0–34.0)
MCHC: 32.6 g/dL (ref 30.0–36.0)
MCV: 92.9 fL (ref 78.0–100.0)
MONOS PCT: 7 %
Monocytes Absolute: 0.7 10*3/uL (ref 0.1–1.0)
NEUTROS ABS: 6.6 10*3/uL (ref 1.7–7.7)
NEUTROS PCT: 71 %
Platelets: 205 10*3/uL (ref 150–400)
RBC: 5.09 MIL/uL (ref 4.22–5.81)
RDW: 12.9 % (ref 11.5–15.5)
WBC: 9.3 10*3/uL (ref 4.0–10.5)

## 2017-10-11 LAB — COMPREHENSIVE METABOLIC PANEL
ALK PHOS: 51 U/L (ref 38–126)
ALT: 22 U/L (ref 17–63)
ANION GAP: 10 (ref 5–15)
AST: 19 U/L (ref 15–41)
Albumin: 4.4 g/dL (ref 3.5–5.0)
BUN: 15 mg/dL (ref 6–20)
CALCIUM: 9.7 mg/dL (ref 8.9–10.3)
CHLORIDE: 103 mmol/L (ref 101–111)
CO2: 24 mmol/L (ref 22–32)
Creatinine, Ser: 0.86 mg/dL (ref 0.61–1.24)
GFR calc non Af Amer: >60 mL/min (ref >60)
Glucose, Bld: 100 mg/dL — ABNORMAL HIGH (ref 65–99)
Potassium: 4.5 mmol/L (ref 3.5–5.1)
SODIUM: 137 mmol/L (ref 135–145)
Total Bilirubin: 0.9 mg/dL (ref 0.3–1.2)
Total Protein: 7.7 g/dL (ref 6.5–8.1)

## 2017-10-11 MED ORDER — HYDROCORTISONE 2.5 % RE CREA
TOPICAL_CREAM | RECTAL | 0 refills | Status: DC
Start: 2017-10-11 — End: 2018-04-21

## 2017-10-11 MED ORDER — DOCUSATE SODIUM 100 MG PO CAPS: 100.0000 mg | ORAL_CAPSULE | Freq: Two times a day (BID) | ORAL | 0 refills | Status: DC

## 2017-10-11 NOTE — Discharge Instructions (Signed)
You were seen in the ED today with hemorrhoids. Apply the meds as needed and return to the ED with any new or worsening symptoms. Discuss possible referral to a surgeon with your PCP if the hemorrhoids become thrombosed or worsen significantly.

## 2017-10-11 NOTE — ED Provider Notes (Signed)
Emergency Department Provider Note   I have reviewed the triage vital signs and the nursing notes.   HISTORY  Chief Complaint Hemorrhoids   HPI Jacob Wood is a 51 y.o. male with PMH of HTN and HLD presents to the emergency department for evaluation of rectal pain and large hemorrhoid for the past 3 days.  No history of hemorrhoids in the past.  Patient denies significant constipation history.  He has noticed some bright red blood with bowel movements which are painful.  No shortness of breath or lightheadedness.  He has not been using any over-the-counter medications at home or sits baths.  He continues to have bowel movements.  No nausea and vomiting.    Past Medical History:  Diagnosis Date  . Hyperlipidemia   . Hypertension     Patient Active Problem List   Diagnosis Date Noted  . Acute pain of left shoulder 08/16/2017  . Pain of left calf 03/17/2017  . BMI 29.0-29.9,adult 12/17/2016  . Essential hypertension 12/17/2016  . Alcohol use 12/17/2016    Past Surgical History:  Procedure Laterality Date  . KNEE CARTILAGE SURGERY      Current Outpatient Rx  . Order #: [id number] Class: Print  . Order #: [id number] Class: Print  . Order #: [id number] Class: Normal  . Order #: [id number] Class: Normal  . Order #: [id number] Class: Normal  . Order #: [id number] Class: Print    Allergies Patient has no known allergies.  History reviewed. No pertinent family history.  Social History Social History   Tobacco Use  . Smoking status: Never Smoker  . Smokeless tobacco: Never Used  Substance Use Topics  . Alcohol use: Yes    Alcohol/week: 50.4 oz    Types: 84 Cans of beer per week    Comment: 2-3 times a week.   . Drug use: No    Review of Systems  Constitutional: No fever/chills Eyes: No visual changes. ENT: No sore throat. Cardiovascular: Denies chest pain. Respiratory: Denies shortness of breath. Gastrointestinal: No abdominal pain.  Positive rectal pain and  blood in stool. No nausea, no vomiting.  No diarrhea.  No constipation. Genitourinary: Negative for dysuria.  Musculoskeletal: Negative for back pain. Skin: Negative for rash. Neurological: Negative for headaches, focal weakness or numbness.  10-point ROS otherwise negative.  ____________________________________________   PHYSICAL EXAM:  VITAL SIGNS: ED Triage Vitals  Enc Vitals Group     BP 10/11/17 1200 (!) 157/100     Pulse Rate 10/11/17 1200 96     Resp 10/11/17 1200 20     Temp 10/11/17 1200 98.3 F (36.8 C)     Temp Source 10/11/17 1200 Oral     SpO2 10/11/17 1200 97 %     Weight 10/11/17 1159 214 lb (97.1 kg)     Height 10/11/17 1159 6\' 1"  (1.854 m)     Pain Score 10/11/17 1159 6    Constitutional: Alert and oriented. Well appearing and in no acute distress. Eyes: Conjunctivae are normal. Head: Atraumatic. Nose: No congestion/rhinnorhea. Mouth/Throat: Mucous membranes are moist.   Neck: No stridor.  Cardiovascular: Normal rate, regular rhythm. Good peripheral circulation. Grossly normal heart sounds.   Respiratory: Normal respiratory effort.  No retractions. Lungs CTAB. Gastrointestinal: Soft and nontender. No distention. Rectal exam shows a large, non-thrombosed hemorrhoid. No surrounding cellulitis or evidence of abscess. No evidence of prolapsed rectum.  Musculoskeletal: No lower extremity tenderness nor edema. No gross deformities of extremities. Neurologic:  Normal speech and language. No gross focal neurologic deficits are  appreciated.  Skin:  Skin is warm, dry and intact. No rash noted.  ____________________________________________   LABS (all labs ordered are listed, but only abnormal results are displayed)  Labs Reviewed  COMPREHENSIVE METABOLIC PANEL - Abnormal; Notable for the following components:      Result Value   Glucose, Bld 100 (*)    All other components within normal limits  CBC WITH DIFFERENTIAL/PLATELET  POC OCCULT BLOOD, ED    ____________________________________________  RADIOLOGY  None ____________________________________________   PROCEDURES  Procedure(s) performed:   Procedures  None ____________________________________________   INITIAL IMPRESSION / ASSESSMENT AND PLAN / ED COURSE  Pertinent labs & imaging results that were available during my care of the patient were reviewed by me and considered in my medical decision making (see chart for details).  Patient presents to the emergency department with a large, external, nonthrombosed hemorrhoid.  He has not been trying over-the-counter medications at home.  No indication for incision and drainage.  This is not consistent with abscess or prolapsed rectum.  Plan for Anusol cream, sitz baths, stool softener with primary care physician follow-up.  At this time, I do not feel there is any life-threatening condition present. I have reviewed and discussed all results (EKG, imaging, lab, urine as appropriate), exam findings with patient. I have reviewed nursing notes and appropriate previous records.  I feel the patient is safe to be discharged home without further emergent workup. Discussed usual and customary return precautions. Patient and family (if present) verbalize understanding and are comfortable with this plan.  Patient will follow-up with their primary care provider. If they do not have a primary care provider, information for follow-up has been provided to them. All questions have been answered.    ____________________________________________  FINAL CLINICAL IMPRESSION(S) / ED DIAGNOSES  Final diagnoses:  Hemorrhoids, unspecified hemorrhoid type     MEDICATIONS GIVEN DURING THIS VISIT:  None  NEW OUTPATIENT MEDICATIONS STARTED DURING THIS VISIT:  Colace and Anusol   Note:  This document was prepared using Systems analyst and may include unintentional dictation errors.  Nanda Quinton, MD Emergency Medicine     Long, [facility], MD 10/11/17 [phone]

## 2017-10-11 NOTE — ED Notes (Signed)
Pt updated on wait at this time, states understanding. 

## 2017-10-11 NOTE — ED Triage Notes (Signed)
Pt c/o "prolapsed" hemorrhoid x 3 days. Bright red blood with bm.nad. Color wnl.

## 2017-10-11 NOTE — ED Notes (Signed)
Nurse unable sign due to esignature pad not working

## 2017-10-20 ENCOUNTER — Ambulatory Visit (INDEPENDENT_AMBULATORY_CARE_PROVIDER_SITE_OTHER): Payer: PRIVATE HEALTH INSURANCE | Admitting: Pediatrics

## 2017-10-20 ENCOUNTER — Encounter: Payer: Self-pay | Admitting: Pediatrics

## 2017-10-20 VITALS — BP 133/90 | HR 71 | Temp 97.0°F | Ht 73.0 in | Wt 215.6 lb

## 2017-10-20 DIAGNOSIS — I10 Essential (primary) hypertension: Secondary | ICD-10-CM | POA: Diagnosis not present

## 2017-10-20 DIAGNOSIS — Z789 Other specified health status: Secondary | ICD-10-CM

## 2017-10-20 DIAGNOSIS — E785 Hyperlipidemia, unspecified: Secondary | ICD-10-CM

## 2017-10-20 DIAGNOSIS — Z7289 Other problems related to lifestyle: Secondary | ICD-10-CM

## 2017-10-20 DIAGNOSIS — M25512 Pain in left shoulder: Secondary | ICD-10-CM

## 2017-10-20 MED ORDER — LOSARTAN POTASSIUM 50 MG PO TABS: 50.0000 mg | ORAL_TABLET | Freq: Every day | ORAL | 1 refills | Status: DC

## 2017-10-20 MED ORDER — PRAVASTATIN SODIUM 40 MG PO TABS: 40.0000 mg | ORAL_TABLET | Freq: Every day | ORAL | 2 refills | Status: DC

## 2017-10-20 NOTE — Progress Notes (Signed)
  Subjective:   Patient ID: Jacob Wood, male    DOB: July 09, 1966, 51 y.o.   MRN: [id number] CC: Follow-up (6 month) multiple med problems HPI: Jacob Wood is a 51 y.o. male presenting for Follow-up (6 month)  HTN: elevated at home  Elevated BMI: was hoping to lose more weight Has had a hurt shoulder, leg injury that has prevented as much exercise as he would like  Drinking 12 pack Friday and Saturday  Not drinking the rest of the week for the most part  HLD: taking statin regularly, no s/e  Relevant past medical, surgical, family and social history reviewed. Allergies and medications reviewed and updated. Social History   Tobacco Use  Smoking Status Never Smoker  Smokeless Tobacco Never Used   ROS: Per HPI   Objective:    BP 133/90   Pulse 71   Temp (!) 97 F (36.1 C) (Oral)   Ht 6' 1" (1.854 m)   Wt 215 lb 9.6 oz (97.8 kg)   BMI 28.44 kg/m   Wt Readings from Last 3 Encounters:  10/20/17 215 lb 9.6 oz (97.8 kg)  10/11/17 214 lb (97.1 kg)  08/30/17 214 lb (97.1 kg)    Gen: NAD, alert, cooperative with exam, NCAT EYES: EOMI, no conjunctival injection, or no icterus ENT:  TMs pearly gray b/l, OP without erythema LYMPH: no cervical LAD CV: NRRR, normal S1/S2, no murmur, distal pulses 2+ b/l Resp: CTABL, no wheezes, normal WOB Abd: +BS, soft, NTND. Ext: No edema, warm Neuro: Alert and oriented MSK: rotator cuff muscles intact b/l shoulders, pain with external rotation L shoulder against resistance  Assessment & Plan:  Jacob Wood was seen today for follow-up multiple med problems.  Diagnoses and all orders for this visit:  Essential hypertension Remains elevated Will start below Check at home rtc 2 weeks for lab recheck after ARB start -     CMP14+EGFR; Future -     losartan (COZAAR) 50 MG tablet; Take 1 tablet (50 mg total) by mouth daily.  Hyperlipidemia, unspecified hyperlipidemia type Stable, cont below -     pravastatin (PRAVACHOL) 40 MG tablet;  Take 1 tablet (40 mg total) by mouth daily. -     Lipid panel; Future  Acute pain of left shoulder Offered PT referral, pt wants to wait  Alcohol use Continue to encourage decrease, recommendations for no more than 5 drinks at one sitting, less than 14 drinks in a week  Elevated BMI Cont to stay active, avoid excess sugar including   Follow up plan: Return in about 6 months (around 04/20/2018). Jacob Found, MD [place]

## 2017-10-27 ENCOUNTER — Ambulatory Visit: Payer: PRIVATE HEALTH INSURANCE | Admitting: Pediatrics

## 2017-11-04 ENCOUNTER — Other Ambulatory Visit: Payer: BLUE CROSS/BLUE SHIELD

## 2017-11-04 ENCOUNTER — Ambulatory Visit (INDEPENDENT_AMBULATORY_CARE_PROVIDER_SITE_OTHER): Payer: BLUE CROSS/BLUE SHIELD | Admitting: *Deleted

## 2017-11-04 VITALS — BP 131/85 | HR 71

## 2017-11-04 DIAGNOSIS — I10 Essential (primary) hypertension: Secondary | ICD-10-CM

## 2017-11-04 DIAGNOSIS — E785 Hyperlipidemia, unspecified: Secondary | ICD-10-CM | POA: Diagnosis not present

## 2017-11-04 NOTE — Progress Notes (Signed)
Patient is here today for bp follow up. Patients BP 131/85   Pulse 71 . Patient advised that we would send over reading to provider to review and if any changes needed to be made we would contact him.

## 2017-11-05 LAB — CMP14+EGFR
ALBUMIN: 4.8 g/dL (ref 3.5–5.5)
ALK PHOS: 50 IU/L (ref 39–117)
ALT: 23 IU/L (ref 0–44)
AST: 20 IU/L (ref 0–40)
Albumin/Globulin Ratio: 2.2 (ref 1.2–2.2)
BILIRUBIN TOTAL: 0.5 mg/dL (ref 0.0–1.2)
BUN/Creatinine Ratio: 13 (ref 9–20)
BUN: 12 mg/dL (ref 6–24)
CHLORIDE: 102 mmol/L (ref 96–106)
CO2: 24 mmol/L (ref 20–29)
CREATININE: 0.92 mg/dL (ref 0.76–1.27)
Calcium: 9.6 mg/dL (ref 8.7–10.2)
GFR calc non Af Amer: 97 mL/min/{1.73_m2} (ref >59)
GFR, EST AFRICAN AMERICAN: 113 mL/min/{1.73_m2} (ref >59)
GLOBULIN, TOTAL: 2.2 g/dL (ref 1.5–4.5)
Glucose: 86 mg/dL (ref 65–99)
Potassium: 4.3 mmol/L (ref 3.5–5.2)
SODIUM: 141 mmol/L (ref 134–144)
TOTAL PROTEIN: 7 g/dL (ref 6.0–8.5)

## 2017-11-05 LAB — LIPID PANEL
CHOLESTEROL TOTAL: 217 mg/dL — AB (ref 100–199)
Chol/HDL Ratio: 4.1 ratio (ref 0.0–5.0)
HDL: 53 mg/dL (ref >39)
LDL CALC: 129 mg/dL — AB (ref 0–99)
Triglycerides: 173 mg/dL — ABNORMAL HIGH (ref 0–149)
VLDL CHOLESTEROL CAL: 35 mg/dL (ref 5–40)

## 2018-01-17 ENCOUNTER — Other Ambulatory Visit: Payer: Self-pay | Admitting: *Deleted

## 2018-01-17 DIAGNOSIS — I10 Essential (primary) hypertension: Secondary | ICD-10-CM

## 2018-01-17 NOTE — Telephone Encounter (Signed)
Fax received [facility] Dosage change requested Losartan 50 mg tab recalled Requested Rx to be changes to 100 mg Please advise

## 2018-01-18 MED ORDER — LOSARTAN POTASSIUM 100 MG PO TABS: 50.0000 mg | ORAL_TABLET | Freq: Every day | ORAL | 1 refills | Status: DC

## 2018-01-18 NOTE — Telephone Encounter (Signed)
Sent in 100mg  tabs, take HALF tab, 50mg  once a day. 90 day supply sent in. Due for next visit in 3 mo.

## 2018-01-19 ENCOUNTER — Telehealth: Payer: Self-pay | Admitting: *Deleted

## 2018-01-19 NOTE — Telephone Encounter (Signed)
See refill on 03/19

## 2018-04-21 ENCOUNTER — Ambulatory Visit (INDEPENDENT_AMBULATORY_CARE_PROVIDER_SITE_OTHER): Payer: BLUE CROSS/BLUE SHIELD | Admitting: Pediatrics

## 2018-04-21 ENCOUNTER — Encounter: Payer: Self-pay | Admitting: Pediatrics

## 2018-04-21 VITALS — BP 133/88 | HR 67 | Temp 97.7°F | Ht 73.0 in | Wt 225.8 lb

## 2018-04-21 DIAGNOSIS — E785 Hyperlipidemia, unspecified: Secondary | ICD-10-CM

## 2018-04-21 DIAGNOSIS — Z6829 Body mass index (BMI) 29.0-29.9, adult: Secondary | ICD-10-CM | POA: Diagnosis not present

## 2018-04-21 DIAGNOSIS — Z7289 Other problems related to lifestyle: Secondary | ICD-10-CM

## 2018-04-21 DIAGNOSIS — I10 Essential (primary) hypertension: Secondary | ICD-10-CM

## 2018-04-21 DIAGNOSIS — Z789 Other specified health status: Secondary | ICD-10-CM

## 2018-04-21 MED ORDER — PRAVASTATIN SODIUM 40 MG PO TABS: 40.0000 mg | ORAL_TABLET | Freq: Every day | ORAL | 2 refills | Status: DC

## 2018-04-21 MED ORDER — LOSARTAN POTASSIUM 50 MG PO TABS: 50.0000 mg | ORAL_TABLET | Freq: Every day | ORAL | 1 refills | Status: DC

## 2018-04-21 NOTE — Progress Notes (Signed)
  Subjective:   Patient ID: Jacob Wood, male    DOB: April 29, 1966, 52 y.o.   MRN: [id number] CC: Medical Management of Chronic Issues  HPI: Jacob Wood is a 52 y.o. male   Here for follow-up.  Hypertension: Taking medicine regularly.  50 mg of losartan in the morning.  No chest pain or shortness of breath with exercise.  Rarely he will feel slightly lightheaded when he first stands up, goes away within a few seconds.  No heart palpitations.  Hyperlipidemia: Taking medicine regularly, tolerating well.  Alcohol use: Drinks about 20 beers between Friday and Saturday every week.  Has cut back some from what he was doing before.  Elevated BMI: Riding bicycle 30 to 40 minutes 3-4 times a week.  Mostly drinking water during the week.  Snacking on cheese a lot during the week.  Relevant past medical, surgical, family and social history reviewed. Allergies and medications reviewed and updated. Social History   Tobacco Use  Smoking Status Never Smoker  Smokeless Tobacco Never Used   ROS: Per HPI   Objective:    BP 133/88   Pulse 67   Temp 97.7 F (36.5 C) (Oral)   Ht 6\' 1"  (1.854 m)   Wt 225 lb 12.8 oz (102.4 kg)   BMI 29.79 kg/m   Wt Readings from Last 3 Encounters:  04/21/18 225 lb 12.8 oz (102.4 kg)  10/20/17 215 lb 9.6 oz (97.8 kg)  10/11/17 214 lb (97.1 kg)    Gen: NAD, alert, cooperative with exam, NCAT EYES: EOMI, no conjunctival injection, or no icterus ENT:  TMs pearly gray b/l, OP without erythema CV: NRRR, normal S1/S2, no murmur, distal pulses 2+ b/l Resp: CTABL, no wheezes, normal WOB Abd: +BS, soft, NTND. no guarding or organomegaly Ext: No edema, warm Neuro: Alert and oriented, strength equal b/l UE and LE, coordination grossly normal MSK: normal muscle bulk  Assessment & Plan:  Jacob Wood was seen today for medical management of chronic issues.  Diagnoses and all orders for this visit:  Essential hypertension Stable, continue below -     losartan  (COZAAR) 50 MG tablet; Take 1 tablet (50 mg total) by mouth daily.  Hyperlipidemia, unspecified hyperlipidemia type Stable, continue below -     pravastatin (PRAVACHOL) 40 MG tablet; Take 1 tablet (40 mg total) by mouth daily.  Alcohol use Ongoing, encouraged decrease.  BMI 29.0-29.9,adult Continue lifestyle changes, regular activity, increasing fruits and vegetables, decreasing sugar in diet.  Offered tetanus shot, unclear when patient's last one was, patient declined.  Follow up plan: Return in about 6 months (around 10/21/2018). Assunta Found, MD [place]

## 2018-10-23 ENCOUNTER — Ambulatory Visit (INDEPENDENT_AMBULATORY_CARE_PROVIDER_SITE_OTHER): Payer: BLUE CROSS/BLUE SHIELD

## 2018-10-23 ENCOUNTER — Encounter: Payer: Self-pay | Admitting: Pediatrics

## 2018-10-23 ENCOUNTER — Ambulatory Visit (INDEPENDENT_AMBULATORY_CARE_PROVIDER_SITE_OTHER): Payer: BLUE CROSS/BLUE SHIELD | Admitting: Pediatrics

## 2018-10-23 VITALS — BP 134/82 | HR 75 | Temp 99.3°F | Ht 73.0 in | Wt 228.2 lb

## 2018-10-23 DIAGNOSIS — E785 Hyperlipidemia, unspecified: Secondary | ICD-10-CM

## 2018-10-23 DIAGNOSIS — I10 Essential (primary) hypertension: Secondary | ICD-10-CM | POA: Diagnosis not present

## 2018-10-23 DIAGNOSIS — G8929 Other chronic pain: Secondary | ICD-10-CM | POA: Diagnosis not present

## 2018-10-23 DIAGNOSIS — M25561 Pain in right knee: Secondary | ICD-10-CM

## 2018-10-23 DIAGNOSIS — Z6830 Body mass index (BMI) 30.0-30.9, adult: Secondary | ICD-10-CM | POA: Diagnosis not present

## 2018-10-23 MED ORDER — NAPROXEN 500 MG PO TABS: 500.0000 mg | ORAL_TABLET | Freq: Two times a day (BID) | ORAL | 1 refills | Status: DC | PRN

## 2018-10-23 MED ORDER — LOSARTAN POTASSIUM 50 MG PO TABS: 50.0000 mg | ORAL_TABLET | Freq: Every day | ORAL | 2 refills | Status: DC

## 2018-10-23 MED ORDER — PRAVASTATIN SODIUM 40 MG PO TABS: 40.0000 mg | ORAL_TABLET | Freq: Every day | ORAL | 2 refills | Status: DC

## 2018-10-23 NOTE — Progress Notes (Signed)
  Subjective:   Patient ID: Jacob Wood, male    DOB: 1966/02/27, 52 y.o.   MRN: [id number] CC: Medical Management of Chronic Issues and Knee Pain (right)  HPI: Jacob Wood is a 52 y.o. male   Knee pain: Right knee has been bothering him for the last 2 months.  Going down stairs bothers him more than going up stairs.  Walking also hurts.  He points to the front lower part of his knee with where the pain is the most.  No swelling, no redness.  He had similar pain in the left knee, says he had a knee surgery to " clean up the knee" with improved symptoms following it.  He has been taking ibuprofen 200 mg 4 times a day for the last month or so.  HLD: Tolerating statin, taking regularly  HTN: Checks blood pressure at times at [facility].  Usually about 130/80.  Alcohol use: Working 6 days a week, drinking only Saturday nights, about 12 beers.  Elevated BMI: Has not been able to be as active as usual, usually rides bicycle regularly.  Knee pain has been limiting him.  Relevant past medical, surgical, family and social history reviewed. Allergies and medications reviewed and updated. Social History   Tobacco Use  Smoking Status Never Smoker  Smokeless Tobacco Never Used   ROS: Per HPI   Objective:    BP 134/82   Pulse 75   Temp 99.3 F (37.4 C) (Oral)   Ht 6' 1" (1.854 m)   Wt 228 lb 3.2 oz (103.5 kg)   BMI 30.11 kg/m   Wt Readings from Last 3 Encounters:  10/23/18 228 lb 3.2 oz (103.5 kg)  04/21/18 225 lb 12.8 oz (102.4 kg)  10/20/17 215 lb 9.6 oz (97.8 kg)    Gen: NAD, alert, cooperative with exam, NCAT EYES: EOMI, no conjunctival injection, or no icterus ENT:   OP without erythema LYMPH: no cervical LAD CV: NRRR, normal S1/S2, no murmur, distal pulses 2+ b/l Resp: CTABL, no wheezes, normal WOB Ext: No edema, warm Neuro: Alert and oriented, strength equal b/l UE and LE, coordination grossly normal MSK: Right knee normal to inspection, normal range of motion.  No  effusion.  No joint line tenderness. Assessment & Plan:  Hristopher was seen today for medical management of chronic issues and knee pain.  Diagnoses and all orders for this visit:  Chronic pain of right knee Discussed NSAIDs, avoid exacerbating activities.  Will get x-ray.  Referral to orthopedics and patient ready.  He declines for now. -     DG Knee 1-2 Views Right; Future -     naproxen (NAPROSYN) 500 MG tablet; Take 1 tablet (500 mg total) by mouth 2 (two) times daily as needed.  Essential hypertension Stable, continue below.  If persistently elevated, let me know -     losartan (COZAAR) 50 MG tablet; Take 1 tablet (50 mg total) by mouth daily. -     CMP14+EGFR  Hyperlipidemia, unspecified hyperlipidemia type Stable, continue below -     pravastatin (PRAVACHOL) 40 MG tablet; Take 1 tablet (40 mg total) by mouth daily.  BMI 30.0-30.9,adult Continue to encourage cutting back alcohol intake, increase fruits and vegetables, decreased carbohydrate intake, increase physical activity.  Follow up plan: Return in about 6 months (around 04/24/2019). Jacob Found, MD [place]

## 2018-10-24 LAB — CMP14+EGFR
ALBUMIN: 5 g/dL (ref 3.5–5.5)
ALT: 28 IU/L (ref 0–44)
AST: 26 IU/L (ref 0–40)
Albumin/Globulin Ratio: 2.4 — ABNORMAL HIGH (ref 1.2–2.2)
Alkaline Phosphatase: 51 IU/L (ref 39–117)
BILIRUBIN TOTAL: 0.4 mg/dL (ref 0.0–1.2)
BUN / CREAT RATIO: 18 (ref 9–20)
BUN: 17 mg/dL (ref 6–24)
CHLORIDE: 103 mmol/L (ref 96–106)
CO2: 20 mmol/L (ref 20–29)
Calcium: 9.5 mg/dL (ref 8.7–10.2)
Creatinine, Ser: 0.97 mg/dL (ref 0.76–1.27)
GFR, EST AFRICAN AMERICAN: 105 mL/min/{1.73_m2} (ref >59)
GFR, EST NON AFRICAN AMERICAN: 91 mL/min/{1.73_m2} (ref >59)
Globulin, Total: 2.1 g/dL (ref 1.5–4.5)
Glucose: 86 mg/dL (ref 65–99)
POTASSIUM: 4.4 mmol/L (ref 3.5–5.2)
Sodium: 143 mmol/L (ref 134–144)
TOTAL PROTEIN: 7.1 g/dL (ref 6.0–8.5)

## 2019-01-09 ENCOUNTER — Encounter: Payer: Self-pay | Admitting: Nurse Practitioner

## 2019-01-09 ENCOUNTER — Ambulatory Visit (INDEPENDENT_AMBULATORY_CARE_PROVIDER_SITE_OTHER): Payer: BLUE CROSS/BLUE SHIELD | Admitting: Nurse Practitioner

## 2019-01-09 VITALS — BP 131/86 | HR 86 | Temp 98.5°F | Ht 73.0 in | Wt 229.0 lb

## 2019-01-09 DIAGNOSIS — L02511 Cutaneous abscess of right hand: Secondary | ICD-10-CM | POA: Diagnosis not present

## 2019-01-09 DIAGNOSIS — Z23 Encounter for immunization: Secondary | ICD-10-CM

## 2019-01-09 MED ORDER — SULFAMETHOXAZOLE-TRIMETHOPRIM 800-160 MG PO TABS: 1.0000 | ORAL_TABLET | Freq: Two times a day (BID) | ORAL | 0 refills | Status: DC

## 2019-01-09 NOTE — Addendum Note (Signed)
Addended by: Rolena Infante on: 01/09/2019 02:25 PM   Modules accepted: Orders

## 2019-01-09 NOTE — Progress Notes (Signed)
   Subjective:    Patient ID: Jacob Wood, male    DOB: 1966/03/11, 53 y.o.   MRN: [id number]   Chief Complaint: Raised area to right pointer finger   HPI Patient comes in with a sliver of wood in his right index finger that is now red and swollen. Has been in finger for 9 days.   Review of Systems  Respiratory: Negative.   Cardiovascular: Negative.   Neurological: Negative.   Psychiatric/Behavioral: Negative.   All other systems reviewed and are negative.       Objective:   Physical Exam Vitals signs and nursing note reviewed.  Constitutional:      Appearance: Normal appearance.  Cardiovascular:     Rate and Rhythm: Normal rate and regular rhythm.     Heart sounds: Normal heart sounds.  Pulmonary:     Effort: Pulmonary effort is normal.     Breath sounds: Normal breath sounds.  Skin:    General: Skin is warm.     Comments: 2cm raised sore lesion on right index finger  Neurological:     General: No focal deficit present.     Mental Status: He is alert.  Psychiatric:        Mood and Affect: Mood normal.        Behavior: Behavior normal.    BP 131/86   Pulse 86   Temp 98.5 F (36.9 C) (Oral)   Ht 6\' 1"  (1.854 m)   Wt 229 lb (103.9 kg)   BMI 30.21 kg/m   I & D Date/Time: 01/09/2019 12:00 PM Performed by: Chevis Pretty, FNP Authorized by: Chevis Pretty, FNP   Consent:    Consent obtained:  Verbal   Consent given by:  Patient   Risks discussed:  Infection   Alternatives discussed:  No treatment Location:    Type:  Abscess (form foreign body in right index finger)   Size:  2cm   Location: right index finger. Pre-procedure details:    Skin preparation:  Betadine Anesthesia (see MAR for exact dosages):    Anesthesia method:  Local infiltration   Local anesthetic:  Lidocaine 2% w/o epi Procedure type:    Complexity:  Simple Procedure details:    Needle aspiration: no     Incision types:  Stab incision   Incision depth:   Subcutaneous   Scalpel blade:  15   Wound management:  Probed and deloculated, irrigated with saline, extensive cleaning and debrided   Drainage:  Purulent   Drainage amount:  Scant   Wound treatment:  Wound left open   Packing materials:  None Post-procedure details:    Patient tolerance of procedure:  Tolerated well, no immediate complications          Assessment & Plan:  Jacob Wood in today with chief complaint of Raised area to right pointer finger   1. Abscess of right index finger Keep clean and dry Soak in espom salt bid Meds ordered this encounter  Medications  . sulfamethoxazole-trimethoprim (BACTRIM DS) 800-160 MG tablet    Sig: Take 1 tablet by mouth 2 (two) times daily.    Dispense:  20 tablet    Refill:  0    Order Specific Question:   Supervising Provider    Answer:   Caryl Pina A [[id number]]   Tetanus shot given today RTO prn  Mary-Margaret Hassell Done, FNP

## 2019-01-09 NOTE — Patient Instructions (Signed)
Wound Care, Adult  Taking care of your wound properly can help to prevent pain, infection, and scarring. It can also help your wound to heal more quickly.  How to care for your wound  Wound care          Follow instructions from your health care provider about how to take care of your wound. Make sure you:  ? Wash your hands with soap and water before you change the bandage (dressing). If soap and water are not available, use hand sanitizer.  ? Change your dressing as told by your health care provider.  ? Leave stitches (sutures), skin glue, or adhesive strips in place. These skin closures may need to stay in place for 2 weeks or longer. If adhesive strip edges start to loosen and curl up, you may trim the loose edges. Do not remove adhesive strips completely unless your health care provider tells you to do that.   Check your wound area every day for signs of infection. Check for:  ? Redness, swelling, or pain.  ? Fluid or blood.  ? Warmth.  ? Pus or a bad smell.   Ask your health care provider if you should clean the wound with mild soap and water. Doing this may include:  ? Using a clean towel to pat the wound dry after cleaning it. Do not rub or scrub the wound.  ? Applying a cream or ointment. Do this only as told by your health care provider.  ? Covering the incision with a clean dressing.   Ask your health care provider when you can leave the wound uncovered.   Keep the dressing dry until your health care provider says it can be removed. Do not take baths, swim, use a hot tub, or do anything that would put the wound underwater until your health care provider approves. Ask your health care provider if you can take showers. You may only be allowed to take sponge baths.  Medicines     If you were prescribed an antibiotic medicine, cream, or ointment, take or use the antibiotic as told by your health care provider. Do not stop taking or using the antibiotic even if your condition improves.   Take  over-the-counter and prescription medicines only as told by your health care provider. If you were prescribed pain medicine, take it 30 or more minutes before you do any wound care or as told by your health care provider.  General instructions   Return to your normal activities as told by your health care provider. Ask your health care provider what activities are safe.   Do not scratch or pick at the wound.   Do not use any products that contain nicotine or tobacco, such as cigarettes and e-cigarettes. These may delay wound healing. If you need help quitting, ask your health care provider.   Keep all follow-up visits as told by your health care provider. This is important.   Eat a diet that includes protein, vitamin A, vitamin C, and other nutrient-rich foods to help the wound heal.  ? Foods rich in protein include meat, dairy, beans, nuts, and other sources.  ? Foods rich in vitamin A include carrots and dark green, leafy vegetables.  ? Foods rich in vitamin C include citrus, tomatoes, and other fruits and vegetables.  ? Nutrient-rich foods have protein, carbohydrates, fat, vitamins, or minerals. Eat a variety of healthy foods including vegetables, fruits, and whole grains.  Contact a health care provider if:     You received a tetanus shot and you have swelling, severe pain, redness, or bleeding at the injection site.   Your pain is not controlled with medicine.   You have redness, swelling, or pain around the wound.   You have fluid or blood coming from the wound.   Your wound feels warm to the touch.   You have pus or a bad smell coming from the wound.   You have a fever or chills.   You are nauseous or you vomit.   You are dizzy.  Get help right away if:   You have a red streak going away from your wound.   The edges of the wound open up and separate.   Your wound is bleeding, and the bleeding does not stop with gentle pressure.   You have a rash.   You faint.   You have trouble  breathing.  Summary   Always wash your hands with soap and water before changing your bandage (dressing).   To help with healing, eat foods that are rich in protein, vitamin A, vitamin C, and other nutrients.   Check your wound every day for signs of infection. Contact your health care provider if you suspect that your wound is infected.  This information is not intended to replace advice given to you by your health care provider. Make sure you discuss any questions you have with your health care provider.  Document Released: 07/27/2008 Document Revised: 11/29/2017 Document Reviewed: 05/04/2016  Elsevier Interactive Patient Education  2019 [employer]

## 2019-04-25 ENCOUNTER — Other Ambulatory Visit: Payer: Self-pay

## 2019-04-26 ENCOUNTER — Ambulatory Visit: Payer: BLUE CROSS/BLUE SHIELD | Admitting: Pediatrics

## 2019-04-26 ENCOUNTER — Ambulatory Visit (INDEPENDENT_AMBULATORY_CARE_PROVIDER_SITE_OTHER): Payer: BC Managed Care – PPO | Admitting: Family Medicine

## 2019-04-26 ENCOUNTER — Encounter: Payer: Self-pay | Admitting: Family Medicine

## 2019-04-26 VITALS — BP 143/96 | HR 84 | Temp 98.3°F | Ht 73.0 in | Wt 225.6 lb

## 2019-04-26 DIAGNOSIS — I10 Essential (primary) hypertension: Secondary | ICD-10-CM

## 2019-04-26 DIAGNOSIS — E785 Hyperlipidemia, unspecified: Secondary | ICD-10-CM

## 2019-04-26 DIAGNOSIS — Z1211 Encounter for screening for malignant neoplasm of colon: Secondary | ICD-10-CM

## 2019-04-26 DIAGNOSIS — Z6829 Body mass index (BMI) 29.0-29.9, adult: Secondary | ICD-10-CM

## 2019-04-26 DIAGNOSIS — E782 Mixed hyperlipidemia: Secondary | ICD-10-CM

## 2019-04-26 MED ORDER — LOSARTAN POTASSIUM 50 MG PO TABS: 50.0000 mg | ORAL_TABLET | Freq: Every day | ORAL | 2 refills | Status: DC

## 2019-04-26 MED ORDER — PRAVASTATIN SODIUM 40 MG PO TABS: 40.0000 mg | ORAL_TABLET | Freq: Every day | ORAL | 2 refills | Status: DC

## 2019-04-26 NOTE — Progress Notes (Signed)
BP (!) 143/96   Pulse 84   Temp 98.3 F (36.8 C) (Oral)   Ht _0  (1.854 m)   Wt 225 lb 9.6 oz (102.3 kg)   BMI 29.76 kg/m    Subjective:   Patient ID: Jacob Wood, male    DOB: 07-23-1966, 53 y.o.   MRN: [id number]  HPI: Jacob Wood is a 53 y.o. male presenting on 04/26/2019 for [facility] Evette Doffing pt) and Hypertension (6 month followup)   HPI Hyperlipidemia Patient is coming in for recheck of his hyperlipidemia. The patient is currently taking pravastatin. They deny any issues with myalgias or history of liver damage from it. They deny any focal numbness or weakness or chest pain.   Hypertension Patient is currently on losartan, and their blood pressure today is 143/96. Patient denies any lightheadedness or dizziness. Patient denies headaches, blurred vision, chest pains, shortness of breath, or weakness. Denies any side effects from medication and is content with current medication.   Relevant past medical, surgical, family and social history reviewed and updated as indicated. Interim medical history since our last visit reviewed. Allergies and medications reviewed and updated.  Review of Systems  Constitutional: Negative for chills and fever.  Eyes: Negative for visual disturbance.  Respiratory: Negative for shortness of breath and wheezing.   Cardiovascular: Negative for chest pain and leg swelling.  Musculoskeletal: Negative for back pain and gait problem.  Skin: Negative for rash.  Neurological: Negative for dizziness, weakness and light-headedness.  All other systems reviewed and are negative.   Per HPI unless specifically indicated above   Allergies as of 04/26/2019   No Known Allergies     Medication List       Accurate as of April 26, 2019  3:58 PM. If you have any questions, ask your nurse or doctor.        STOP taking these medications   naproxen 500 MG tablet Commonly known as: Naprosyn Stopped by: Worthy Rancher, MD    sulfamethoxazole-trimethoprim 800-160 MG tablet Commonly known as: Bactrim DS Stopped by: Fransisca Kaufmann Miyako Oelke, MD     TAKE these medications   losartan 50 MG tablet Commonly known as: COZAAR Take 1 tablet (50 mg total) by mouth daily.   pravastatin 40 MG tablet Commonly known as: PRAVACHOL Take 1 tablet (40 mg total) by mouth daily.        Objective:   BP (!) 143/96   Pulse 84   Temp 98.3 F (36.8 C) (Oral)   Ht _1  (1.854 m)   Wt 225 lb 9.6 oz (102.3 kg)   BMI 29.76 kg/m   Wt Readings from Last 3 Encounters:  04/26/19 225 lb 9.6 oz (102.3 kg)  01/09/19 229 lb (103.9 kg)  10/23/18 228 lb 3.2 oz (103.5 kg)    Physical Exam Vitals signs and nursing note reviewed.  Constitutional:      General: He is not in acute distress.    Appearance: He is well-developed. He is not diaphoretic.  Eyes:     General: No scleral icterus.    Conjunctiva/sclera: Conjunctivae normal.  Neck:     Musculoskeletal: Neck supple.     Thyroid: No thyromegaly.  Cardiovascular:     Rate and Rhythm: Normal rate and regular rhythm.     Heart sounds: Normal heart sounds. No murmur.  Pulmonary:     Effort: Pulmonary effort is normal. No respiratory distress.     Breath sounds: Normal breath sounds. No wheezing.  Musculoskeletal: Normal  range of motion.  Lymphadenopathy:     Cervical: No cervical adenopathy.  Skin:    General: Skin is warm and dry.     Findings: No rash.  Neurological:     Mental Status: He is alert and oriented to person, place, and time.     Coordination: Coordination normal.  Psychiatric:        Behavior: Behavior normal.       Assessment & Plan:   Problem List Items Addressed This Visit      Cardiovascular and Mediastinum   Essential hypertension - Primary   Relevant Medications   losartan (COZAAR) 50 MG tablet   pravastatin (PRAVACHOL) 40 MG tablet   Other Relevant Orders   CBC with Differential/Platelet   CMP14+EGFR     Other   Adult BMI 29.0-29.9  kg/sq m   Hyperlipidemia   Relevant Medications   losartan (COZAAR) 50 MG tablet   pravastatin (PRAVACHOL) 40 MG tablet   Other Relevant Orders   Lipid panel    Other Visit Diagnoses    Colon cancer screening       Relevant Orders   Ambulatory referral to Gastroenterology       Follow up plan: Return in about 6 months (around 10/26/2019), or if symptoms worsen or fail to improve, for Hypertension and cholesterol.  Counseling provided for all of the vaccine components Orders Placed This Encounter  Procedures  . CBC with Differential/Platelet  . CMP14+EGFR  . Lipid panel  . Ambulatory referral to Gastroenterology    Caryl Pina, MD [facility] 04/26/2019, 3:58 PM

## 2019-04-27 ENCOUNTER — Telehealth: Payer: Self-pay | Admitting: Family Medicine

## 2019-04-27 LAB — CMP14+EGFR
ALT: 24 IU/L (ref 0–44)
AST: 24 IU/L (ref 0–40)
Albumin/Globulin Ratio: 2 (ref 1.2–2.2)
Albumin: 4.9 g/dL (ref 4.0–5.0)
Alkaline Phosphatase: 46 IU/L (ref 39–117)
BUN/Creatinine Ratio: 15 (ref 9–20)
BUN: 16 mg/dL (ref 6–24)
Bilirubin Total: 0.3 mg/dL (ref 0.0–1.2)
CO2: 23 mmol/L (ref 20–29)
Calcium: 9.9 mg/dL (ref 8.7–10.2)
Chloride: 103 mmol/L (ref 96–106)
Creatinine, Ser: 1.1 mg/dL (ref 0.76–1.27)
GFR calc Af Amer: 90 mL/min/{1.73_m2} (ref >59)
GFR calc non Af Amer: 78 mL/min/{1.73_m2} (ref >59)
Globulin, Total: 2.4 g/dL (ref 1.5–4.5)
Glucose: 89 mg/dL (ref 65–99)
Potassium: 4.6 mmol/L (ref 3.5–5.2)
Sodium: 145 mmol/L — ABNORMAL HIGH (ref 134–144)
Total Protein: 7.3 g/dL (ref 6.0–8.5)

## 2019-04-27 LAB — LIPID PANEL
Chol/HDL Ratio: 5 ratio (ref 0.0–5.0)
Cholesterol, Total: 218 mg/dL — ABNORMAL HIGH (ref 100–199)
HDL: 44 mg/dL (ref >39)
LDL Calculated: 125 mg/dL — ABNORMAL HIGH (ref 0–99)
Triglycerides: 245 mg/dL — ABNORMAL HIGH (ref 0–149)
VLDL Cholesterol Cal: 49 mg/dL — ABNORMAL HIGH (ref 5–40)

## 2019-04-27 LAB — CBC WITH DIFFERENTIAL/PLATELET
Basophils Absolute: 0.1 10*3/uL (ref 0.0–0.2)
Basos: 1 %
EOS (ABSOLUTE): 0.1 10*3/uL (ref 0.0–0.4)
Eos: 1 %
Hematocrit: 42.4 % (ref 37.5–51.0)
Hemoglobin: 14.4 g/dL (ref 13.0–17.7)
Immature Grans (Abs): 0 10*3/uL (ref 0.0–0.1)
Immature Granulocytes: 0 %
Lymphocytes Absolute: 1.9 10*3/uL (ref 0.7–3.1)
Lymphs: 23 %
MCH: 31.4 pg (ref 26.6–33.0)
MCHC: 34 g/dL (ref 31.5–35.7)
MCV: 93 fL (ref 79–97)
Monocytes Absolute: 0.5 10*3/uL (ref 0.1–0.9)
Monocytes: 7 %
Neutrophils Absolute: 5.7 10*3/uL (ref 1.4–7.0)
Neutrophils: 68 %
Platelets: 209 10*3/uL (ref 150–450)
RBC: 4.58 x10E6/uL (ref 4.14–5.80)
RDW: 12.9 % (ref 11.6–15.4)
WBC: 8.3 10*3/uL (ref 3.4–10.8)

## 2019-04-27 NOTE — Telephone Encounter (Signed)
FYI

## 2019-04-30 ENCOUNTER — Encounter (INDEPENDENT_AMBULATORY_CARE_PROVIDER_SITE_OTHER): Payer: Self-pay | Admitting: *Deleted

## 2019-07-20 ENCOUNTER — Encounter: Payer: Self-pay | Admitting: *Deleted

## 2019-10-24 ENCOUNTER — Ambulatory Visit (INDEPENDENT_AMBULATORY_CARE_PROVIDER_SITE_OTHER): Payer: BC Managed Care – PPO | Admitting: Family Medicine

## 2019-10-24 ENCOUNTER — Other Ambulatory Visit: Payer: Self-pay

## 2019-10-24 ENCOUNTER — Encounter: Payer: Self-pay | Admitting: Family Medicine

## 2019-10-24 VITALS — BP 139/88 | HR 80 | Temp 99.1°F | Ht 73.0 in | Wt 230.2 lb

## 2019-10-24 DIAGNOSIS — I10 Essential (primary) hypertension: Secondary | ICD-10-CM

## 2019-10-24 DIAGNOSIS — Z125 Encounter for screening for malignant neoplasm of prostate: Secondary | ICD-10-CM | POA: Diagnosis not present

## 2019-10-24 DIAGNOSIS — Z1211 Encounter for screening for malignant neoplasm of colon: Secondary | ICD-10-CM

## 2019-10-24 DIAGNOSIS — E785 Hyperlipidemia, unspecified: Secondary | ICD-10-CM

## 2019-10-24 MED ORDER — LOSARTAN POTASSIUM 50 MG PO TABS
50.0000 mg | ORAL_TABLET | Freq: Every day | ORAL | 2 refills | Status: DC
Start: 2019-10-24 — End: 2020-04-22

## 2019-10-24 MED ORDER — PRAVASTATIN SODIUM 40 MG PO TABS: 40.0000 mg | ORAL_TABLET | Freq: Every day | ORAL | 2 refills | Status: DC

## 2019-10-24 NOTE — Progress Notes (Signed)
BP 139/88   Pulse 80   Temp 99.1 F (37.3 C) (Temporal)   Ht 6' 1"  (1.854 m)   Wt 230 lb 3.2 oz (104.4 kg)   SpO2 98%   BMI 30.37 kg/m    Subjective:   Patient ID: Jacob Wood, male    DOB: [birth year], 53 y.o.   MRN: [id number]  HPI: Joanna Hall is a 53 y.o. male presenting on 10/24/2019 for Hypertension (6 month follow up)   HPI Hypertension Patient is currently on losartan, and their blood pressure today is 139/88 but he says at home it usually runs 130/80 and this is kind of high for him today in the office.. Patient denies any lightheadedness or dizziness. Patient denies headaches, blurred vision, chest pains, shortness of breath, or weakness. Denies any side effects from medication and is content with current medication.   Hyperlipidemia Patient is coming in for recheck of his hyperlipidemia. The patient is currently taking pravastatin and fish oil. They deny any issues with myalgias or history of liver damage from it. They deny any focal numbness or weakness or chest pain.   Relevant past medical, surgical, family and social history reviewed and updated as indicated. Interim medical history since our last visit reviewed. Allergies and medications reviewed and updated.  Review of Systems  Constitutional: Negative for chills and fever.  HENT: Negative for ear pain and tinnitus.   Eyes: Negative for pain and discharge.  Respiratory: Negative for cough, shortness of breath and wheezing.   Cardiovascular: Negative for chest pain, palpitations and leg swelling.  Gastrointestinal: Negative for abdominal pain, blood in stool, constipation and diarrhea.  Genitourinary: Negative for dysuria and hematuria.  Musculoskeletal: Negative for back pain, gait problem and myalgias.  Skin: Negative for rash.  Neurological: Negative for dizziness, weakness and headaches.  Psychiatric/Behavioral: Negative for suicidal ideas.  All other systems reviewed and are negative.   Per HPI  unless specifically indicated above   Allergies as of 10/24/2019   No Known Allergies     Medication List       Accurate as of October 24, 2019  4:34 PM. If you have any questions, ask your nurse or doctor.        Fish Oil 1000 MG Caps Take by mouth.   losartan 50 MG tablet Commonly known as: COZAAR Take 1 tablet (50 mg total) by mouth daily.   multivitamin capsule Take 1 capsule by mouth daily.   pravastatin 40 MG tablet Commonly known as: PRAVACHOL Take 1 tablet (40 mg total) by mouth daily.        Objective:   BP 139/88   Pulse 80   Temp 99.1 F (37.3 C) (Temporal)   Ht 6' 1"  (1.854 m)   Wt 230 lb 3.2 oz (104.4 kg)   SpO2 98%   BMI 30.37 kg/m   Wt Readings from Last 3 Encounters:  10/24/19 230 lb 3.2 oz (104.4 kg)  04/26/19 225 lb 9.6 oz (102.3 kg)  01/09/19 229 lb (103.9 kg)    Physical Exam Vitals reviewed.  Constitutional:      General: He is not in acute distress.    Appearance: He is well-developed. He is not diaphoretic.  HENT:     Right Ear: External ear normal.     Left Ear: External ear normal.     Nose: Nose normal.     Mouth/Throat:     Pharynx: No oropharyngeal exudate.  Eyes:     General: No scleral icterus.  Conjunctiva/sclera: Conjunctivae normal.  Neck:     Thyroid: No thyromegaly.  Cardiovascular:     Rate and Rhythm: Normal rate and regular rhythm.     Heart sounds: Normal heart sounds. No murmur.  Pulmonary:     Effort: Pulmonary effort is normal. No respiratory distress.     Breath sounds: Normal breath sounds. No wheezing.  Abdominal:     General: Bowel sounds are normal. There is no distension.     Palpations: Abdomen is soft.     Tenderness: There is no abdominal tenderness. There is no guarding or rebound.  Musculoskeletal:        General: Normal range of motion.     Cervical back: Neck supple.  Lymphadenopathy:     Cervical: No cervical adenopathy.  Skin:    General: Skin is warm and dry.     Findings: No  rash.  Neurological:     Mental Status: He is alert and oriented to person, place, and time.     Coordination: Coordination normal.  Psychiatric:        Behavior: Behavior normal.     Assessment & Plan:   Problem List Items Addressed This Visit      Cardiovascular and Mediastinum   Essential hypertension   Relevant Medications   losartan (COZAAR) 50 MG tablet   pravastatin (PRAVACHOL) 40 MG tablet   Other Relevant Orders   CBC with Differential/Platelet   CMP14+EGFR     Other   Hyperlipidemia   Relevant Medications   losartan (COZAAR) 50 MG tablet   pravastatin (PRAVACHOL) 40 MG tablet   Other Relevant Orders   Lipid panel    Other Visit Diagnoses    Prostate cancer screening    -  Primary   Relevant Orders   PSA, total and free   Colon cancer screening       Relevant Orders   Ambulatory referral to Gastroenterology    Continue losartan and pravastatin, no change in medication, referral for colonoscopy and will check PSA today.  Follow up plan: Return in about 6 months (around 04/23/2020), or if symptoms worsen or fail to improve, for Hypertension and cholesterol recheck.  Counseling provided for all of the vaccine components Orders Placed This Encounter  Procedures  . PSA, total and free  . CBC with Differential/Platelet  . CMP14+EGFR  . Lipid panel  . Ambulatory referral to Gastroenterology    Caryl Pina, MD [facility] 10/24/2019, 4:34 PM

## 2019-10-25 LAB — CMP14+EGFR
ALT: 28 IU/L (ref 0–44)
AST: 26 IU/L (ref 0–40)
Albumin/Globulin Ratio: 2 (ref 1.2–2.2)
Albumin: 4.9 g/dL (ref 3.8–4.9)
Alkaline Phosphatase: 48 IU/L (ref 39–117)
BUN/Creatinine Ratio: 13 (ref 9–20)
BUN: 12 mg/dL (ref 6–24)
Bilirubin Total: 0.5 mg/dL (ref 0.0–1.2)
CO2: 24 mmol/L (ref 20–29)
Calcium: 9.1 mg/dL (ref 8.7–10.2)
Chloride: 104 mmol/L (ref 96–106)
Creatinine, Ser: 0.89 mg/dL (ref 0.76–1.27)
GFR calc Af Amer: 114 mL/min/{1.73_m2} (ref >59)
GFR calc non Af Amer: 99 mL/min/{1.73_m2} (ref >59)
Globulin, Total: 2.4 g/dL (ref 1.5–4.5)
Glucose: 87 mg/dL (ref 65–99)
Potassium: 4.7 mmol/L (ref 3.5–5.2)
Sodium: 141 mmol/L (ref 134–144)
Total Protein: 7.3 g/dL (ref 6.0–8.5)

## 2019-10-25 LAB — LIPID PANEL
Chol/HDL Ratio: 4 ratio (ref 0.0–5.0)
Cholesterol, Total: 211 mg/dL — ABNORMAL HIGH (ref 100–199)
HDL: 53 mg/dL (ref >39)
LDL Chol Calc (NIH): 139 mg/dL — ABNORMAL HIGH (ref 0–99)
Triglycerides: 108 mg/dL (ref 0–149)
VLDL Cholesterol Cal: 19 mg/dL (ref 5–40)

## 2019-10-25 LAB — CBC WITH DIFFERENTIAL/PLATELET
Basophils Absolute: 0 10*3/uL (ref 0.0–0.2)
Basos: 1 %
EOS (ABSOLUTE): 0.1 10*3/uL (ref 0.0–0.4)
Eos: 1 %
Hematocrit: 40.8 % (ref 37.5–51.0)
Hemoglobin: 14.2 g/dL (ref 13.0–17.7)
Immature Grans (Abs): 0 10*3/uL (ref 0.0–0.1)
Immature Granulocytes: 0 %
Lymphocytes Absolute: 1.6 10*3/uL (ref 0.7–3.1)
Lymphs: 24 %
MCH: 31.2 pg (ref 26.6–33.0)
MCHC: 34.8 g/dL (ref 31.5–35.7)
MCV: 90 fL (ref 79–97)
Monocytes Absolute: 0.5 10*3/uL (ref 0.1–0.9)
Monocytes: 7 %
Neutrophils Absolute: 4.4 10*3/uL (ref 1.4–7.0)
Neutrophils: 67 %
Platelets: 202 10*3/uL (ref 150–450)
RBC: 4.55 x10E6/uL (ref 4.14–5.80)
RDW: 12.7 % (ref 11.6–15.4)
WBC: 6.6 10*3/uL (ref 3.4–10.8)

## 2019-10-25 LAB — PSA, TOTAL AND FREE
PSA, Free Pct: 19.2 %
PSA, Free: 0.23 ng/mL
Prostate Specific Ag, Serum: 1.2 ng/mL (ref 0.0–4.0)

## 2019-10-29 ENCOUNTER — Ambulatory Visit: Payer: BC Managed Care – PPO | Admitting: Family Medicine

## 2019-10-29 ENCOUNTER — Encounter (INDEPENDENT_AMBULATORY_CARE_PROVIDER_SITE_OTHER): Payer: Self-pay | Admitting: *Deleted

## 2019-11-14 ENCOUNTER — Encounter (INDEPENDENT_AMBULATORY_CARE_PROVIDER_SITE_OTHER): Payer: Self-pay | Admitting: *Deleted

## 2020-01-16 ENCOUNTER — Telehealth: Payer: Self-pay | Admitting: Family Medicine

## 2020-01-16 DIAGNOSIS — E785 Hyperlipidemia, unspecified: Secondary | ICD-10-CM

## 2020-01-16 MED ORDER — PRAVASTATIN SODIUM 40 MG PO TABS: 40.0000 mg | ORAL_TABLET | Freq: Every day | ORAL | 2 refills | Status: DC

## 2020-01-16 NOTE — Telephone Encounter (Signed)
Meds sent

## 2020-01-16 NOTE — Telephone Encounter (Signed)
°  Medication Request  01/16/2020  What is the name of the medication? pravastatin  Have you contacted your pharmacy to request a refill? yes  Which pharmacy would you like this sent to? [facility] in [facility]   Patient notified that their request is being sent to the clinical staff for review and that they should receive a call once it is complete. If they do not receive a call within 24 hours they can check with their pharmacy or our office.

## 2020-04-22 ENCOUNTER — Ambulatory Visit: Payer: BC Managed Care – PPO | Admitting: Family Medicine

## 2020-04-22 ENCOUNTER — Telehealth: Payer: Self-pay | Admitting: Family Medicine

## 2020-04-22 DIAGNOSIS — E785 Hyperlipidemia, unspecified: Secondary | ICD-10-CM

## 2020-04-22 DIAGNOSIS — I10 Essential (primary) hypertension: Secondary | ICD-10-CM

## 2020-04-22 MED ORDER — LOSARTAN POTASSIUM 50 MG PO TABS: 50.0000 mg | ORAL_TABLET | Freq: Every day | ORAL | 0 refills | Status: DC

## 2020-04-22 MED ORDER — PRAVASTATIN SODIUM 40 MG PO TABS: 40.0000 mg | ORAL_TABLET | Freq: Every day | ORAL | 0 refills | Status: DC

## 2020-04-22 NOTE — Telephone Encounter (Signed)
1 month supply of losartan and pravastatin sent to pharmacy.  Patient aware to keep appt

## 2020-04-22 NOTE — Telephone Encounter (Signed)
  Prescription Request  04/22/2020  What is the name of the medication or equipment? losartan (COZAAR) 50 MG tablet  pravastatin (PRAVACHOL) 40 MG tablet    Have you contacted your pharmacy to request a refill? (if applicable) No, he had appt with Dr. Keturah Barre. Today but he is on vacation and appt is resch to 05/02/20 he is out of these meds   Which pharmacy would you like this sent to? [facility] [place]    Patient notified that their request is being sent to the clinical staff for review and that they should receive a response within 2 business days.

## 2020-05-02 ENCOUNTER — Encounter: Payer: Self-pay | Admitting: Family Medicine

## 2020-05-02 ENCOUNTER — Other Ambulatory Visit: Payer: Self-pay

## 2020-05-02 ENCOUNTER — Ambulatory Visit (INDEPENDENT_AMBULATORY_CARE_PROVIDER_SITE_OTHER): Payer: BC Managed Care – PPO | Admitting: Family Medicine

## 2020-05-02 VITALS — BP 136/85 | HR 76 | Temp 98.7°F | Ht 73.0 in | Wt 225.0 lb

## 2020-05-02 DIAGNOSIS — E785 Hyperlipidemia, unspecified: Secondary | ICD-10-CM | POA: Diagnosis not present

## 2020-05-02 DIAGNOSIS — I10 Essential (primary) hypertension: Secondary | ICD-10-CM | POA: Diagnosis not present

## 2020-05-02 MED ORDER — LOSARTAN POTASSIUM 50 MG PO TABS: 50.0000 mg | ORAL_TABLET | Freq: Every day | ORAL | 3 refills | Status: DC

## 2020-05-02 MED ORDER — PRAVASTATIN SODIUM 40 MG PO TABS: 40.0000 mg | ORAL_TABLET | Freq: Every day | ORAL | 3 refills | Status: DC

## 2020-05-02 MED ORDER — LOSARTAN POTASSIUM 50 MG PO TABS: 50.0000 mg | ORAL_TABLET | Freq: Every day | ORAL | 0 refills | Status: DC

## 2020-05-02 MED ORDER — PRAVASTATIN SODIUM 40 MG PO TABS: 40.0000 mg | ORAL_TABLET | Freq: Every day | ORAL | 0 refills | Status: DC

## 2020-05-02 NOTE — Progress Notes (Signed)
BP 136/85   Pulse 76   Temp 98.7 F (37.1 C)   Ht _0  (1.854 m)   Wt 225 lb (102.1 kg)   SpO2 98%   BMI 29.69 kg/m    Subjective:   Patient ID: Jacob Wood, male    DOB: 1966-05-09, 54 y.o.   MRN: [id number]  HPI: Jacob Wood is a 54 y.o. male presenting on 05/02/2020 for Medical Management of Chronic Issues and Hypertension   HPI Hypertension Patient is currently on losartan, and their blood pressure today is 136/85. Patient denies any lightheadedness or dizziness. Patient denies headaches, blurred vision, chest pains, shortness of breath, or weakness. Denies any side effects from medication and is content with current medication.  Hyperlipidemia Patient is coming in for recheck of his hyperlipidemia. The patient is currently taking pravastatin and fish oil. They deny any issues with myalgias or history of liver damage from it. They deny any focal numbness or weakness or chest pain.   Relevant past medical, surgical, family and social history reviewed and updated as indicated. Interim medical history since our last visit reviewed. Allergies and medications reviewed and updated.  Review of Systems  Constitutional: Negative for chills and fever.  Eyes: Negative for visual disturbance.  Respiratory: Negative for shortness of breath and wheezing.   Cardiovascular: Negative for chest pain and leg swelling.  Musculoskeletal: Negative for back pain and gait problem.  Skin: Negative for rash.  Neurological: Negative for dizziness, weakness and numbness.  All other systems reviewed and are negative.   Per HPI unless specifically indicated above   Allergies as of 05/02/2020   No Known Allergies     Medication List       Accurate as of May 02, 2020  4:50 PM. If you have any questions, ask your nurse or doctor.        Fish Oil 1000 MG Caps Take by mouth.   losartan 50 MG tablet Commonly known as: COZAAR Take 1 tablet (50 mg total) by mouth daily.   multivitamin  capsule Take 1 capsule by mouth daily.   pravastatin 40 MG tablet Commonly known as: PRAVACHOL Take 1 tablet (40 mg total) by mouth daily.        Objective:   BP 136/85   Pulse 76   Temp 98.7 F (37.1 C)   Ht _1  (1.854 m)   Wt 225 lb (102.1 kg)   SpO2 98%   BMI 29.69 kg/m   Wt Readings from Last 3 Encounters:  05/02/20 225 lb (102.1 kg)  10/24/19 230 lb 3.2 oz (104.4 kg)  04/26/19 225 lb 9.6 oz (102.3 kg)    Physical Exam Vitals and nursing note reviewed.  Constitutional:      General: He is not in acute distress.    Appearance: He is well-developed. He is not diaphoretic.  Eyes:     General: No scleral icterus.    Conjunctiva/sclera: Conjunctivae normal.  Neck:     Thyroid: No thyromegaly.  Cardiovascular:     Rate and Rhythm: Normal rate and regular rhythm.     Heart sounds: Normal heart sounds. No murmur heard.   Pulmonary:     Effort: Pulmonary effort is normal. No respiratory distress.     Breath sounds: Normal breath sounds. No wheezing.  Musculoskeletal:        General: Normal range of motion.     Cervical back: Neck supple.  Lymphadenopathy:     Cervical: No cervical adenopathy.  Skin:  General: Skin is warm and dry.     Findings: No rash.  Neurological:     Mental Status: He is alert and oriented to person, place, and time.     Coordination: Coordination normal.  Psychiatric:        Behavior: Behavior normal.       Assessment & Plan:   Problem List Items Addressed This Visit      Cardiovascular and Mediastinum   Essential hypertension   Relevant Orders   CMP14+EGFR   CBC with Differential/Platelet     Other   Hyperlipidemia - Primary   Relevant Orders   CBC with Differential/Platelet   Lipid panel      Continue pravastatin and fish oil, seems to be doing well. Follow up plan: Return in about 6 months (around 11/02/2020), or if symptoms worsen or fail to improve, for Physical exam.  Counseling provided for all of the  vaccine components Orders Placed This Encounter  Procedures  . CMP14+EGFR  . CBC with Differential/Platelet  . Lipid panel    Caryl Pina, MD [facility] 05/02/2020, 4:50 PM

## 2020-05-03 LAB — CBC WITH DIFFERENTIAL/PLATELET
Basophils Absolute: 0.1 10*3/uL (ref 0.0–0.2)
Basos: 1 %
EOS (ABSOLUTE): 0.1 10*3/uL (ref 0.0–0.4)
Eos: 1 %
Hematocrit: 39.1 % (ref 37.5–51.0)
Hemoglobin: 13.3 g/dL (ref 13.0–17.7)
Immature Grans (Abs): 0 10*3/uL (ref 0.0–0.1)
Immature Granulocytes: 0 %
Lymphocytes Absolute: 1.6 10*3/uL (ref 0.7–3.1)
Lymphs: 23 %
MCH: 31.2 pg (ref 26.6–33.0)
MCHC: 34 g/dL (ref 31.5–35.7)
MCV: 92 fL (ref 79–97)
Monocytes Absolute: 0.5 10*3/uL (ref 0.1–0.9)
Monocytes: 7 %
Neutrophils Absolute: 4.8 10*3/uL (ref 1.4–7.0)
Neutrophils: 68 %
Platelets: 184 10*3/uL (ref 150–450)
RBC: 4.26 x10E6/uL (ref 4.14–5.80)
RDW: 12.9 % (ref 11.6–15.4)
WBC: 7 10*3/uL (ref 3.4–10.8)

## 2020-05-03 LAB — CMP14+EGFR
ALT: 25 IU/L (ref 0–44)
AST: 19 IU/L (ref 0–40)
Albumin/Globulin Ratio: 1.8 (ref 1.2–2.2)
Albumin: 4.6 g/dL (ref 3.8–4.9)
Alkaline Phosphatase: 48 IU/L (ref 48–121)
BUN/Creatinine Ratio: 14 (ref 9–20)
BUN: 16 mg/dL (ref 6–24)
Bilirubin Total: 0.5 mg/dL (ref 0.0–1.2)
CO2: 22 mmol/L (ref 20–29)
Calcium: 9.4 mg/dL (ref 8.7–10.2)
Chloride: 106 mmol/L (ref 96–106)
Creatinine, Ser: 1.14 mg/dL (ref 0.76–1.27)
GFR calc Af Amer: 86 mL/min/{1.73_m2} (ref >59)
GFR calc non Af Amer: 74 mL/min/{1.73_m2} (ref >59)
Globulin, Total: 2.6 g/dL (ref 1.5–4.5)
Glucose: 89 mg/dL (ref 65–99)
Potassium: 4.5 mmol/L (ref 3.5–5.2)
Sodium: 142 mmol/L (ref 134–144)
Total Protein: 7.2 g/dL (ref 6.0–8.5)

## 2020-05-03 LAB — LIPID PANEL
Chol/HDL Ratio: 3.9 ratio (ref 0.0–5.0)
Cholesterol, Total: 216 mg/dL — ABNORMAL HIGH (ref 100–199)
HDL: 55 mg/dL (ref >39)
LDL Chol Calc (NIH): 131 mg/dL — ABNORMAL HIGH (ref 0–99)
Triglycerides: 172 mg/dL — ABNORMAL HIGH (ref 0–149)
VLDL Cholesterol Cal: 30 mg/dL (ref 5–40)

## 2020-05-20 ENCOUNTER — Telehealth: Payer: Self-pay | Admitting: Family Medicine

## 2020-05-20 DIAGNOSIS — I10 Essential (primary) hypertension: Secondary | ICD-10-CM

## 2020-05-20 MED ORDER — LOSARTAN POTASSIUM 50 MG PO TABS: 50.0000 mg | ORAL_TABLET | Freq: Every day | ORAL | 3 refills | Status: DC

## 2020-05-20 NOTE — Telephone Encounter (Signed)
Patient aware and verbalized understanding rx sent

## 2020-05-20 NOTE — Telephone Encounter (Signed)
Pt had an apt 05/02/2020 with Dettinger and losartan (COZAAR) 50 MG tablet still has not been called in to [facility]. Pt needs a 6 mo supply.

## 2020-05-21 ENCOUNTER — Encounter: Payer: Self-pay | Admitting: *Deleted

## 2020-11-03 ENCOUNTER — Encounter: Payer: BC Managed Care – PPO | Admitting: Family Medicine

## 2020-11-28 ENCOUNTER — Other Ambulatory Visit: Payer: Self-pay

## 2020-11-28 ENCOUNTER — Ambulatory Visit (INDEPENDENT_AMBULATORY_CARE_PROVIDER_SITE_OTHER): Payer: BC Managed Care – PPO | Admitting: Family Medicine

## 2020-11-28 ENCOUNTER — Encounter: Payer: Self-pay | Admitting: Family Medicine

## 2020-11-28 VITALS — BP 142/92 | HR 79 | Ht 73.0 in | Wt 234.0 lb

## 2020-11-28 DIAGNOSIS — Z0000 Encounter for general adult medical examination without abnormal findings: Secondary | ICD-10-CM

## 2020-11-28 DIAGNOSIS — Z0001 Encounter for general adult medical examination with abnormal findings: Secondary | ICD-10-CM

## 2020-11-28 DIAGNOSIS — E782 Mixed hyperlipidemia: Secondary | ICD-10-CM | POA: Diagnosis not present

## 2020-11-28 DIAGNOSIS — I10 Essential (primary) hypertension: Secondary | ICD-10-CM | POA: Diagnosis not present

## 2020-11-28 DIAGNOSIS — Z1211 Encounter for screening for malignant neoplasm of colon: Secondary | ICD-10-CM | POA: Diagnosis not present

## 2020-11-28 DIAGNOSIS — Z23 Encounter for immunization: Secondary | ICD-10-CM | POA: Diagnosis not present

## 2020-11-28 LAB — CBC WITH DIFFERENTIAL/PLATELET
Basophils Absolute: 0 10*3/uL (ref 0.0–0.2)
EOS (ABSOLUTE): 0.1 10*3/uL (ref 0.0–0.4)
Immature Grans (Abs): 0 10*3/uL (ref 0.0–0.1)
Immature Granulocytes: 0 %
MCV: 93 fL (ref 79–97)
Neutrophils Absolute: 4.4 10*3/uL (ref 1.4–7.0)
RDW: 13 % (ref 11.6–15.4)

## 2020-11-28 LAB — CMP14+EGFR

## 2020-11-28 NOTE — Progress Notes (Signed)
 BP (!) 142/92   Pulse 79   Ht 6' 1" (1.854 m)   Wt 234 lb (106.1 kg)   SpO2 100%   BMI 30.87 kg/m    Subjective:   Patient ID: Jacob Wood, male    DOB: 06/17/1966, 55 y.o.   MRN: [id number]  HPI: Jacob Wood is a 55 y.o. male presenting on 11/28/2020 for No chief complaint on file.   HPI Adult well exam and physical Patient is coming today for adult well exam and physical recheck of chronic medical issues. He denies any major health issues. His blood pressure is borderline today 142/92 and says it has been running okay at home but we will keep an eye on it. Patient denies any chest pain, shortness of breath, headaches or vision issues, abdominal complaints, diarrhea, nausea, vomiting, or joint issues.   Relevant past medical, surgical, family and social history reviewed and updated as indicated. Interim medical history since our last visit reviewed. Allergies and medications reviewed and updated.  Review of Systems  Constitutional: Negative for chills and fever.  HENT: Negative for ear pain and tinnitus.   Respiratory: Negative for cough, shortness of breath and wheezing.   Cardiovascular: Negative for chest pain, palpitations and leg swelling.  Gastrointestinal: Negative for abdominal pain, blood in stool, constipation and diarrhea.  Genitourinary: Negative for dysuria and hematuria.  Musculoskeletal: Negative for back pain, gait problem and myalgias.  Skin: Negative for rash.  Neurological: Negative for dizziness, weakness and headaches.  Psychiatric/Behavioral: Negative for suicidal ideas.  All other systems reviewed and are negative.   Per HPI unless specifically indicated above   Allergies as of 11/28/2020   No Known Allergies     Medication List       Accurate as of November 28, 2020 10:43 AM. If you have any questions, ask your nurse or doctor.        Fish Oil 1000 MG Caps Take by mouth.   losartan 50 MG tablet Commonly known as: COZAAR Take 1  tablet (50 mg total) by mouth daily.   multivitamin capsule Take 1 capsule by mouth daily.   pravastatin 40 MG tablet Commonly known as: PRAVACHOL Take 1 tablet (40 mg total) by mouth daily.        Objective:   BP (!) 142/92   Pulse 79   Ht 6' 1" (1.854 m)   Wt 234 lb (106.1 kg)   SpO2 100%   BMI 30.87 kg/m   Wt Readings from Last 3 Encounters:  11/28/20 234 lb (106.1 kg)  05/02/20 225 lb (102.1 kg)  10/24/19 230 lb 3.2 oz (104.4 kg)    Physical Exam Vitals reviewed.  Constitutional:      General: He is not in acute distress.    Appearance: He is well-developed and well-nourished. He is not diaphoretic.  HENT:     Right Ear: External ear normal.     Left Ear: External ear normal.     Nose: Nose normal.     Mouth/Throat:     Mouth: Oropharynx is clear and moist.     Pharynx: No oropharyngeal exudate.  Eyes:     General: No scleral icterus.    Extraocular Movements: Extraocular movements intact and EOM normal.     Conjunctiva/sclera: Conjunctivae normal.     Pupils: Pupils are equal, round, and reactive to light.  Neck:     Thyroid: No thyromegaly.  Cardiovascular:     Rate and Rhythm: Normal rate and regular rhythm.       Pulses: Intact distal pulses.     Heart sounds: Normal heart sounds. No murmur heard.   Pulmonary:     Effort: Pulmonary effort is normal. No respiratory distress.     Breath sounds: Normal breath sounds. No wheezing.  Abdominal:     General: Bowel sounds are normal. There is no distension.     Palpations: Abdomen is soft.     Tenderness: There is no abdominal tenderness. There is no guarding or rebound.  Genitourinary:    Prostate: Normal.     Rectum: External hemorrhoid (Small external hemorrhoid no bleeding no pain) present.  Musculoskeletal:        General: No edema. Normal range of motion.     Cervical back: Neck supple.  Lymphadenopathy:     Cervical: No cervical adenopathy.  Skin:    General: Skin is warm and dry.      Findings: No rash.  Neurological:     Mental Status: He is alert and oriented to person, place, and time.     Coordination: Coordination normal.  Psychiatric:        Mood and Affect: Mood and affect normal.        Behavior: Behavior normal.       Assessment & Plan:   Problem List Items Addressed This Visit      Cardiovascular and Mediastinum   Essential hypertension   Relevant Orders   CMP14+EGFR     Other   Hyperlipidemia - Primary   Relevant Orders   Lipid panel    Other Visit Diagnoses    Well adult exam       Relevant Orders   CBC with Differential/Platelet   CMP14+EGFR   Lipid panel   PSA, total and free   Colon cancer screening       Relevant Orders   Ambulatory referral to Gastroenterology      Referral for colonoscopy and will check blood work, cholesterol still up may switch to Crestor at high dose instead of the pravastatin. Follow up plan: Return in about 6 months (around 05/28/2021), or if symptoms worsen or fail to improve, for htn and hld.  Counseling provided for all of the vaccine components No orders of the defined types were placed in this encounter.   Joshua Dettinger, MD [facility] 11/28/2020, 10:43 AM     

## 2020-11-29 LAB — CMP14+EGFR
ALT: 32 IU/L (ref 0–44)
AST: 28 IU/L (ref 0–40)
Albumin/Globulin Ratio: 2.3 — ABNORMAL HIGH (ref 1.2–2.2)
Albumin: 5.2 g/dL — ABNORMAL HIGH (ref 3.8–4.9)
Alkaline Phosphatase: 51 IU/L (ref 44–121)
BUN/Creatinine Ratio: 15 (ref 9–20)
BUN: 13 mg/dL (ref 6–24)
Bilirubin Total: 0.3 mg/dL (ref 0.0–1.2)
CO2: 23 mmol/L (ref 20–29)
Calcium: 9.9 mg/dL (ref 8.7–10.2)
Chloride: 104 mmol/L (ref 96–106)
GFR calc Af Amer: 115 mL/min/{1.73_m2} (ref >59)
GFR calc non Af Amer: 99 mL/min/{1.73_m2} (ref >59)
Globulin, Total: 2.3 g/dL (ref 1.5–4.5)
Glucose: 86 mg/dL (ref 65–99)
Potassium: 4.8 mmol/L (ref 3.5–5.2)
Sodium: 143 mmol/L (ref 134–144)
Total Protein: 7.5 g/dL (ref 6.0–8.5)

## 2020-11-29 LAB — LIPID PANEL
Chol/HDL Ratio: 5.1 ratio — ABNORMAL HIGH (ref 0.0–5.0)
Cholesterol, Total: 254 mg/dL — ABNORMAL HIGH (ref 100–199)
HDL: 50 mg/dL (ref >39)
LDL Chol Calc (NIH): 162 mg/dL — ABNORMAL HIGH (ref 0–99)
Triglycerides: 229 mg/dL — ABNORMAL HIGH (ref 0–149)
VLDL Cholesterol Cal: 42 mg/dL — ABNORMAL HIGH (ref 5–40)

## 2020-11-29 LAB — PSA, TOTAL AND FREE
PSA, Free Pct: 25.5 %
PSA, Free: 0.28 ng/mL
Prostate Specific Ag, Serum: 1.1 ng/mL (ref 0.0–4.0)

## 2020-11-29 LAB — CBC WITH DIFFERENTIAL/PLATELET
Basos: 1 %
Eos: 2 %
Hematocrit: 43.2 % (ref 37.5–51.0)
Hemoglobin: 14.7 g/dL (ref 13.0–17.7)
Lymphocytes Absolute: 1.5 10*3/uL (ref 0.7–3.1)
Lymphs: 23 %
MCH: 31.5 pg (ref 26.6–33.0)
MCHC: 34 g/dL (ref 31.5–35.7)
Monocytes Absolute: 0.5 10*3/uL (ref 0.1–0.9)
Monocytes: 7 %
Neutrophils: 67 %
Platelets: 218 10*3/uL (ref 150–450)
RBC: 4.67 x10E6/uL (ref 4.14–5.80)
WBC: 6.6 10*3/uL (ref 3.4–10.8)

## 2020-12-03 ENCOUNTER — Other Ambulatory Visit: Payer: Self-pay

## 2020-12-03 MED ORDER — ROSUVASTATIN CALCIUM 40 MG PO TABS: 40.0000 mg | ORAL_TABLET | Freq: Every day | ORAL | 3 refills | Status: DC

## 2020-12-23 ENCOUNTER — Encounter: Payer: Self-pay | Admitting: *Deleted

## 2021-02-06 ENCOUNTER — Ambulatory Visit (AMBULATORY_SURGERY_CENTER): Payer: PRIVATE HEALTH INSURANCE | Admitting: *Deleted

## 2021-02-06 ENCOUNTER — Other Ambulatory Visit: Payer: Self-pay

## 2021-02-06 VITALS — Ht 73.0 in | Wt 238.0 lb

## 2021-02-06 DIAGNOSIS — Z1211 Encounter for screening for malignant neoplasm of colon: Secondary | ICD-10-CM

## 2021-02-06 MED ORDER — SUPREP BOWEL PREP KIT 17.5-3.13-1.6 GM/177ML PO SOLN: 1.0000 | Freq: Once | ORAL | 0 refills | Status: AC

## 2021-02-06 NOTE — Progress Notes (Signed)

## 2021-02-17 ENCOUNTER — Encounter: Payer: Self-pay | Admitting: Internal Medicine

## 2021-02-20 ENCOUNTER — Ambulatory Visit (AMBULATORY_SURGERY_CENTER): Payer: BC Managed Care – PPO | Admitting: Internal Medicine

## 2021-02-20 ENCOUNTER — Encounter: Payer: Self-pay | Admitting: Internal Medicine

## 2021-02-20 ENCOUNTER — Other Ambulatory Visit: Payer: Self-pay

## 2021-02-20 VITALS — BP 112/68 | HR 64 | Temp 98.9°F | Resp 16 | Ht 73.0 in | Wt 238.0 lb

## 2021-02-20 DIAGNOSIS — Z1211 Encounter for screening for malignant neoplasm of colon: Secondary | ICD-10-CM

## 2021-02-20 DIAGNOSIS — D122 Benign neoplasm of ascending colon: Secondary | ICD-10-CM

## 2021-02-20 MED ORDER — SODIUM CHLORIDE 0.9 % IV SOLN: 500.0000 mL | Freq: Once | INTRAVENOUS | Status: DC

## 2021-02-20 NOTE — Op Note (Signed)
[facility] Patient Name: Jacob Wood Procedure Date: 02/20/2021 11:22 AM MRN: [id number] Endoscopist: Docia Chuck. Henrene Pastor , MD Age: 55 Referring MD:  Date of Birth: 1966/05/25 Gender: Male Account #: [id number] Procedure:                Colonoscopy with cold snare polypectomy x 1 Indications:              Screening for colorectal malignant neoplasm Medicines:                Monitored Anesthesia Care Procedure:                Pre-Anesthesia Assessment:                           - Prior to the procedure, a History and Physical                            was performed, and patient medications and                            allergies were reviewed. The patient's tolerance of                            previous anesthesia was also reviewed. The risks                            and benefits of the procedure and the sedation                            options and risks were discussed with the patient.                            All questions were answered, and informed consent                            was obtained. Prior Anticoagulants: The patient has                            taken no previous anticoagulant or antiplatelet                            agents. ASA Grade Assessment: II - A patient with                            mild systemic disease. After reviewing the risks                            and benefits, the patient was deemed in                            satisfactory condition to undergo the procedure.                           After obtaining informed consent, the colonoscope  was passed under direct vision. Throughout the                            procedure, the patient's blood pressure, pulse, and                            oxygen saturations were monitored continuously. The                            Olympus CF-[id number]) Colonoscope was                            introduced through the anus and advanced to the the                             cecum, identified by appendiceal orifice and                            ileocecal valve. The ileocecal valve, appendiceal                            orifice, and rectum were photographed. The quality                            of the bowel preparation was good. The colonoscopy                            was performed without difficulty. The patient                            tolerated the procedure well. The bowel preparation                            used was SUPREP via split dose instruction. Scope In: 11:32:17 AM Scope Out: 11:45:53 AM Scope Withdrawal Time: 0 hours 11 minutes 1 second  Total Procedure Duration: 0 hours 13 minutes 36 seconds  Findings:                 A 3 mm adenomatous appearing polyp was found in the                            ascending colon. The polyp was removed with a cold                            snare. Resection was complete. No meaningful tissue                            retrieved for pathologic submission.                           Multiple diverticula were found in the left colon                            and right colon.  Internal hemorrhoids were found during                            retroflexion. The hemorrhoids were moderate.                           The exam was otherwise without abnormality on                            direct and retroflexion views. Complications:            No immediate complications. Estimated blood loss:                            None. Estimated Blood Loss:     Estimated blood loss: none. Impression:               - One 3 mm polyp in the ascending colon, removed                            with a cold snare.                           - Diverticulosis in the left colon and in the right                            colon.                           - Internal hemorrhoids.                           - The examination was otherwise normal on direct                            and retroflexion  views. Recommendation:           - Repeat colonoscopy in 10 years for surveillance.                           - Patient has a contact number available for                            emergencies. The signs and symptoms of potential                            delayed complications were discussed with the                            patient. Return to normal activities tomorrow.                            Written discharge instructions were provided to the                            patient.                           -  Resume previous diet.                           - Continue present medications.                           - Await pathology results. Docia Chuck. Henrene Pastor, MD 02/20/2021 11:49:37 AM This report has been signed electronically.

## 2021-02-20 NOTE — Progress Notes (Signed)
CW - VS  Pt's states no medical or surgical changes since previsit or office visit.

## 2021-02-20 NOTE — Progress Notes (Signed)
pt tolerated well. VSS. awake and to recovery. Report given to RN.  

## 2021-02-20 NOTE — Patient Instructions (Signed)
Thank you for allowing [place] to care for you today!  Await biopsy results of polyp removed, approximately 7-10 days.  Recommend next screening colonoscopy in 10 years.  Resume previous diet and medications today  Return to your normal daily activities tomorrow.   YOU HAD AN ENDOSCOPIC PROCEDURE TODAY AT [facility]:   Refer to the procedure report that was given to you for any specific questions about what was found during the examination.  If the procedure report does not answer your questions, please call your gastroenterologist to clarify.  If you requested that your care partner not be given the details of your procedure findings, then the procedure report has been included in a sealed envelope for you to review at your convenience later.  YOU SHOULD EXPECT: Some feelings of bloating in the abdomen. Passage of more gas than usual.  Walking can help get rid of the air that was put into your GI tract during the procedure and reduce the bloating. If you had a lower endoscopy (such as a colonoscopy or flexible sigmoidoscopy) you may notice spotting of blood in your stool or on the toilet paper. If you underwent a bowel prep for your procedure, you may not have a normal bowel movement for a few days.  Please Note:  You might notice some irritation and congestion in your nose or some drainage.  This is from the oxygen used during your procedure.  There is no need for concern and it should clear up in a day or so.  SYMPTOMS TO REPORT IMMEDIATELY:   Following lower endoscopy (colonoscopy or flexible sigmoidoscopy):  Excessive amounts of blood in the stool  Significant tenderness or worsening of abdominal pains  Swelling of the abdomen that is new, acute  Fever of 100F or higher   For urgent or emergent issues, a gastroenterologist can be reached at any hour by calling [phone]. Do not use MyChart messaging for urgent concerns.    DIET:  We do recommend a small meal at  first, but then you may proceed to your regular diet.  Drink plenty of fluids but you should avoid alcoholic beverages for 24 hours.  ACTIVITY:  You should plan to take it easy for the rest of today and you should NOT DRIVE or use heavy machinery until tomorrow (because of the sedation medicines used during the test).    FOLLOW UP: Our staff will call the number listed on your records 48-72 hours following your procedure to check on you and address any questions or concerns that you may have regarding the information given to you following your procedure. If we do not reach you, we will leave a message.  We will attempt to reach you two times.  During this call, we will ask if you have developed any symptoms of COVID 19. If you develop any symptoms (ie: fever, flu-like symptoms, shortness of breath, cough etc.) before then, please call [phone].  If you test positive for Covid 19 in the 2 weeks post procedure, please call and report this information to [place].    If any biopsies were taken you will be contacted by phone or by letter within the next 1-3 weeks.  Please call us at [phone] if you have not heard about the biopsies in 3 weeks.    SIGNATURES/CONFIDENTIALITY: You and/or your care partner have signed paperwork which will be entered into your electronic medical record.  These signatures attest to the fact that that the information above  on your After Visit Summary has been reviewed and is understood.  Full responsibility of the confidentiality of this discharge information lies with you and/or your care-partner.

## 2021-02-20 NOTE — Progress Notes (Signed)
Called to room to assist during endoscopic procedure.  Patient ID and intended procedure confirmed with present staff. Received instructions for my participation in the procedure from the performing physician.  

## 2021-02-24 ENCOUNTER — Telehealth: Payer: Self-pay | Admitting: *Deleted

## 2021-02-24 NOTE — Telephone Encounter (Signed)
  Follow up Call-  Call back number 02/20/2021  Post procedure Call Back phone  # [phone] cell  Permission to leave phone message Yes  Some recent data might be hidden     Patient questions:  Do you have a fever, pain , or abdominal swelling? No. Pain Score  0 *  Have you tolerated food without any problems? Yes.    Have you been able to return to your normal activities? Yes.    Do you have any questions about your discharge instructions: Diet   No. Medications  No. Follow up visit  No.  Do you have questions or concerns about your Care? No.  Actions: * If pain score is 4 or above: No action needed, pain <4

## 2021-05-22 ENCOUNTER — Other Ambulatory Visit: Payer: Self-pay | Admitting: Family Medicine

## 2021-05-22 DIAGNOSIS — I10 Essential (primary) hypertension: Secondary | ICD-10-CM

## 2021-05-29 ENCOUNTER — Ambulatory Visit: Payer: BC Managed Care – PPO | Admitting: Family Medicine

## 2021-06-01 ENCOUNTER — Ambulatory Visit: Payer: BC Managed Care – PPO | Admitting: Family Medicine

## 2021-07-01 ENCOUNTER — Encounter: Payer: Self-pay | Admitting: Family Medicine

## 2021-07-01 ENCOUNTER — Other Ambulatory Visit: Payer: Self-pay

## 2021-07-01 ENCOUNTER — Ambulatory Visit (INDEPENDENT_AMBULATORY_CARE_PROVIDER_SITE_OTHER): Payer: BC Managed Care – PPO | Admitting: Family Medicine

## 2021-07-01 VITALS — BP 134/86 | HR 79 | Ht 73.0 in | Wt 236.0 lb

## 2021-07-01 DIAGNOSIS — E782 Mixed hyperlipidemia: Secondary | ICD-10-CM

## 2021-07-01 DIAGNOSIS — I10 Essential (primary) hypertension: Secondary | ICD-10-CM | POA: Diagnosis not present

## 2021-07-01 DIAGNOSIS — E669 Obesity, unspecified: Secondary | ICD-10-CM | POA: Diagnosis not present

## 2021-07-01 MED ORDER — LOSARTAN POTASSIUM 50 MG PO TABS: 50.0000 mg | ORAL_TABLET | Freq: Every day | ORAL | 3 refills | Status: DC

## 2021-07-01 NOTE — Progress Notes (Signed)
BP 134/86   Pulse 79   Ht _0  (1.854 m)   Wt 236 lb (107 kg)   SpO2 98%   BMI 31.14 kg/m    Subjective:   Patient ID: Jacob Wood, male    DOB: 17-Mar-1966, 55 y.o.   MRN: [id number]  HPI: Jacob Wood is a 55 y.o. male presenting on 07/01/2021 for Medical Management of Chronic Issues, Hypertension, and Hyperlipidemia   HPI Hypertension Patient is currently on losartan, and their blood pressure today is 134/86. Patient denies any lightheadedness or dizziness. Patient denies headaches, blurred vision, chest pains, shortness of breath, or weakness. Denies any side effects from medication and is content with current medication.   Hyperlipidemia Patient is coming in for recheck of his hyperlipidemia. The patient is currently taking fish oil and Crestor. They deny any issues with myalgias or history of liver damage from it. They deny any focal numbness or weakness or chest pain.   Relevant past medical, surgical, family and social history reviewed and updated as indicated. Interim medical history since our last visit reviewed. Allergies and medications reviewed and updated.  Review of Systems  Constitutional:  Negative for chills and fever.  Eyes:  Negative for visual disturbance.  Respiratory:  Negative for shortness of breath and wheezing.   Cardiovascular:  Negative for chest pain and leg swelling.  Musculoskeletal:  Negative for back pain and gait problem.  Skin:  Negative for rash.  Neurological:  Negative for dizziness and weakness.  All other systems reviewed and are negative.  Per HPI unless specifically indicated above   Allergies as of 07/01/2021   No Known Allergies      Medication List        Accurate as of July 01, 2021  3:57 PM. If you have any questions, ask your nurse or doctor.          Fish Oil 1000 MG Caps Take by mouth.   losartan 50 MG tablet Commonly known as: COZAAR Take 1 tablet (50 mg total) by mouth daily.   Melatonin 5 MG  Caps Take 5 mg by mouth as needed.   multivitamin capsule Take 1 capsule by mouth daily.   rosuvastatin 40 MG tablet Commonly known as: Crestor Take 1 tablet (40 mg total) by mouth daily.         Objective:   BP 134/86   Pulse 79   Ht _1  (1.854 m)   Wt 236 lb (107 kg)   SpO2 98%   BMI 31.14 kg/m   Wt Readings from Last 3 Encounters:  07/01/21 236 lb (107 kg)  02/20/21 238 lb (108 kg)  02/06/21 238 lb (108 kg)    Physical Exam Vitals and nursing note reviewed.  Constitutional:      General: He is not in acute distress.    Appearance: He is well-developed. He is not diaphoretic.  Eyes:     General: No scleral icterus.    Conjunctiva/sclera: Conjunctivae normal.  Neck:     Thyroid: No thyromegaly.  Cardiovascular:     Rate and Rhythm: Normal rate and regular rhythm.     Heart sounds: Normal heart sounds. No murmur heard. Pulmonary:     Effort: Pulmonary effort is normal. No respiratory distress.     Breath sounds: Normal breath sounds. No wheezing.  Musculoskeletal:        General: Normal range of motion.     Cervical back: Neck supple.  Lymphadenopathy:     Cervical: No  cervical adenopathy.  Skin:    General: Skin is warm and dry.     Findings: No rash.  Neurological:     Mental Status: He is alert and oriented to person, place, and time.     Coordination: Coordination normal.  Psychiatric:        Behavior: Behavior normal.      Assessment & Plan:   Problem List Items Addressed This Visit       Cardiovascular and Mediastinum   Essential hypertension - Primary   Relevant Medications   losartan (COZAAR) 50 MG tablet   Other Relevant Orders   CMP14+EGFR     Other   Hyperlipidemia   Relevant Medications   losartan (COZAAR) 50 MG tablet   Other Relevant Orders   Lipid panel   Obesity (BMI 30.0-34.9)  Blood pressure looks good, he seems to be doing well with his Crestor, will check to see where his cholesterol is today.  Follow up  plan: Return in about 6 months (around 12/29/2021), or if symptoms worsen or fail to improve, for Physical exam and hypertension and cholesterol.  Counseling provided for all of the vaccine components Orders Placed This Encounter  Procedures   CMP14+EGFR   Lipid panel    Caryl Pina, MD [facility] 07/01/2021, 3:57 PM

## 2021-07-02 LAB — CMP14+EGFR
ALT: 39 IU/L (ref 0–44)
AST: 33 IU/L (ref 0–40)
Albumin/Globulin Ratio: 2 (ref 1.2–2.2)
Albumin: 4.7 g/dL (ref 3.8–4.9)
Alkaline Phosphatase: 56 IU/L (ref 44–121)
BUN/Creatinine Ratio: 19 (ref 9–20)
BUN: 16 mg/dL (ref 6–24)
Bilirubin Total: 0.4 mg/dL (ref 0.0–1.2)
CO2: 23 mmol/L (ref 20–29)
Calcium: 9.3 mg/dL (ref 8.7–10.2)
Chloride: 103 mmol/L (ref 96–106)
Creatinine, Ser: 0.85 mg/dL (ref 0.76–1.27)
Globulin, Total: 2.4 g/dL (ref 1.5–4.5)
Glucose: 99 mg/dL (ref 65–99)
Potassium: 4.5 mmol/L (ref 3.5–5.2)
Sodium: 141 mmol/L (ref 134–144)
Total Protein: 7.1 g/dL (ref 6.0–8.5)
eGFR: 105 mL/min/{1.73_m2} (ref >59)

## 2021-07-02 LAB — LIPID PANEL
Chol/HDL Ratio: 4.7 ratio (ref 0.0–5.0)
Cholesterol, Total: 188 mg/dL (ref 100–199)
HDL: 40 mg/dL (ref >39)
LDL Chol Calc (NIH): 93 mg/dL (ref 0–99)
Triglycerides: 328 mg/dL — ABNORMAL HIGH (ref 0–149)
VLDL Cholesterol Cal: 55 mg/dL — ABNORMAL HIGH (ref 5–40)

## 2021-12-30 ENCOUNTER — Ambulatory Visit (INDEPENDENT_AMBULATORY_CARE_PROVIDER_SITE_OTHER): Payer: BC Managed Care – PPO | Admitting: Family Medicine

## 2021-12-30 ENCOUNTER — Encounter: Payer: Self-pay | Admitting: Family Medicine

## 2021-12-30 VITALS — BP 133/82 | HR 89 | Ht 73.0 in | Wt 236.0 lb

## 2021-12-30 DIAGNOSIS — Z23 Encounter for immunization: Secondary | ICD-10-CM

## 2021-12-30 DIAGNOSIS — Z0001 Encounter for general adult medical examination with abnormal findings: Secondary | ICD-10-CM

## 2021-12-30 DIAGNOSIS — E782 Mixed hyperlipidemia: Secondary | ICD-10-CM

## 2021-12-30 DIAGNOSIS — Z0000 Encounter for general adult medical examination without abnormal findings: Secondary | ICD-10-CM | POA: Diagnosis not present

## 2021-12-30 DIAGNOSIS — Z125 Encounter for screening for malignant neoplasm of prostate: Secondary | ICD-10-CM | POA: Diagnosis not present

## 2021-12-30 DIAGNOSIS — I10 Essential (primary) hypertension: Secondary | ICD-10-CM

## 2021-12-30 MED ORDER — ROSUVASTATIN CALCIUM 40 MG PO TABS: 40.0000 mg | ORAL_TABLET | Freq: Every day | ORAL | 3 refills | Status: DC

## 2021-12-30 NOTE — Progress Notes (Signed)
? ?BP 133/82   Pulse 89   Ht 6' 1" (1.854 m)   Wt 236 lb (107 kg)   SpO2 99%   BMI 31.14 kg/m?   ? ?Subjective:  ? ?Patient ID: Jacob Wood, male    DOB: [birth year], 56 y.o.   MRN: [id number] ? ?HPI: ?Jacob Wood is a 56 y.o. male presenting on 12/30/2021 for Medical Management of Chronic Issues (CPE), Hyperlipidemia, and Hypertension ? ? ?HPI ?Physical exam ?Patient denies any chest pain, shortness of breath, headaches or vision issues, abdominal complaints, diarrhea, nausea, vomiting, or joint issues.  He has started riding on a stationary bicycle and has been doing good for him. ? ?Hypertension ?Patient is currently on losartan, and their blood pressure today is 133/82. Patient denies any lightheadedness or dizziness. Patient denies headaches, blurred vision, chest pains, shortness of breath, or weakness. Denies any side effects from medication and is content with current medication.  ? ?Hyperlipidemia ?Patient is coming in for recheck of his hyperlipidemia. The patient is currently taking Crestor and fish oils. They deny any issues with myalgias or history of liver damage from it. They deny any focal numbness or weakness or chest pain.  ? ?Relevant past medical, surgical, family and social history reviewed and updated as indicated. Interim medical history since our last visit reviewed. ?Allergies and medications reviewed and updated. ? ?Review of Systems  ?Constitutional:  Negative for chills and fever.  ?HENT:  Negative for ear pain and tinnitus.   ?Eyes:  Negative for pain.  ?Respiratory:  Negative for cough, shortness of breath and wheezing.   ?Cardiovascular:  Negative for chest pain, palpitations and leg swelling.  ?Gastrointestinal:  Negative for abdominal pain, blood in stool, constipation and diarrhea.  ?Genitourinary:  Negative for dysuria and hematuria.  ?Musculoskeletal:  Negative for back pain and myalgias.  ?Skin:  Negative for rash.  ?Neurological:  Negative for dizziness, weakness and  headaches.  ?Psychiatric/Behavioral:  Negative for suicidal ideas.   ? ?Per HPI unless specifically indicated above ? ? ?Allergies as of 12/30/2021   ?No Known Allergies ?  ? ?  ?Medication List  ?  ? ?  ? Accurate as of December 30, 2021  2:54 PM. If you have any questions, ask your nurse or doctor.  ?  ?  ? ?  ? ?Fish Oil 1000 MG Caps ?Take by mouth. ?  ?losartan 50 MG tablet ?Commonly known as: COZAAR ?Take 1 tablet (50 mg total) by mouth daily. ?  ?Melatonin 5 MG Caps ?Take 5 mg by mouth as needed. ?  ?multivitamin capsule ?Take 1 capsule by mouth daily. ?  ?rosuvastatin 40 MG tablet ?Commonly known as: Crestor ?Take 1 tablet (40 mg total) by mouth daily. ?  ? ?  ? ? ? ?Objective:  ? ?BP 133/82   Pulse 89   Ht 6' 1" (1.854 m)   Wt 236 lb (107 kg)   SpO2 99%   BMI 31.14 kg/m?   ?Wt Readings from Last 3 Encounters:  ?12/30/21 236 lb (107 kg)  ?07/01/21 236 lb (107 kg)  ?02/20/21 238 lb (108 kg)  ?  ?Physical Exam ?Vitals reviewed.  ?Constitutional:   ?   General: He is not in acute distress. ?   Appearance: He is well-developed. He is not diaphoretic.  ?HENT:  ?   Right Ear: External ear normal.  ?   Left Ear: External ear normal.  ?   Nose: Nose normal.  ?   Mouth/Throat:  ?  Pharynx: No oropharyngeal exudate.  ?Eyes:  ?   General: No scleral icterus.    ?   Right eye: No discharge.  ?   Conjunctiva/sclera: Conjunctivae normal.  ?   Pupils: Pupils are equal, round, and reactive to light.  ?Neck:  ?   Thyroid: No thyromegaly.  ?Cardiovascular:  ?   Rate and Rhythm: Normal rate and regular rhythm.  ?   Heart sounds: Normal heart sounds. No murmur heard. ?Pulmonary:  ?   Effort: Pulmonary effort is normal. No respiratory distress.  ?   Breath sounds: Normal breath sounds. No wheezing.  ?Abdominal:  ?   General: Bowel sounds are normal. There is no distension.  ?   Palpations: Abdomen is soft.  ?   Tenderness: There is no abdominal tenderness. There is no guarding or rebound.  ?Musculoskeletal:     ?   General:  Normal range of motion.  ?   Cervical back: Neck supple.  ?Lymphadenopathy:  ?   Cervical: No cervical adenopathy.  ?Skin: ?   General: Skin is warm and dry.  ?   Findings: No rash.  ?Neurological:  ?   Mental Status: He is alert and oriented to person, place, and time.  ?   Coordination: Coordination normal.  ?Psychiatric:     ?   Behavior: Behavior normal.  ? ? ? ? ?Assessment & Plan:  ? ?Problem List Items Addressed This Visit   ? ?  ? Cardiovascular and Mediastinum  ? Essential hypertension  ? Relevant Medications  ? rosuvastatin (CRESTOR) 40 MG tablet  ? Other Relevant Orders  ? CBC with Differential/Platelet  ? CMP14+EGFR  ?  ? Other  ? Hyperlipidemia  ? Relevant Medications  ? rosuvastatin (CRESTOR) 40 MG tablet  ? Other Relevant Orders  ? Lipid panel  ? ?Other Visit Diagnoses   ? ? Well adult exam    -  Primary  ? Relevant Orders  ? CBC with Differential/Platelet  ? CMP14+EGFR  ? Lipid panel  ? PSA, total and free  ? Need for immunization against influenza      ? Relevant Orders  ? Flu Vaccine QUAD 70moIM (Fluarix, Fluzone & Alfiuria Quad PF) (Completed)  ? Prostate cancer screening      ? Relevant Orders  ? PSA, total and free  ? ?  ?  ?Doing well, continue current medicine, continue to monitor blood pressure. ?Follow up plan: ?Return in about 6 months (around 07/02/2022), or if symptoms worsen or fail to improve, for hyperlipidemia and hypertension. ? ?Counseling provided for all of the vaccine components ?Orders Placed This Encounter  ?Procedures  ? Flu Vaccine QUAD 612moM (Fluarix, Fluzone & Alfiuria Quad PF)  ? CBC with Differential/Platelet  ? CMP14+EGFR  ? Lipid panel  ? PSA, total and free  ? ? ?JoCaryl PinaMD ?[place]3/11/2021, 2:54 PM ? ? ?  ?

## 2021-12-31 LAB — CBC WITH DIFFERENTIAL/PLATELET
Basophils Absolute: 0 10*3/uL (ref 0.0–0.2)
Basos: 1 %
EOS (ABSOLUTE): 0.1 10*3/uL (ref 0.0–0.4)
Eos: 1 %
Hematocrit: 43.7 % (ref 37.5–51.0)
Hemoglobin: 14.5 g/dL (ref 13.0–17.7)
Immature Grans (Abs): 0 10*3/uL (ref 0.0–0.1)
Immature Granulocytes: 0 %
Lymphocytes Absolute: 1.7 10*3/uL (ref 0.7–3.1)
Lymphs: 22 %
MCH: 31.4 pg (ref 26.6–33.0)
MCHC: 33.2 g/dL (ref 31.5–35.7)
MCV: 95 fL (ref 79–97)
Monocytes Absolute: 0.5 10*3/uL (ref 0.1–0.9)
Monocytes: 7 %
Neutrophils Absolute: 5.3 10*3/uL (ref 1.4–7.0)
Neutrophils: 69 %
Platelets: 197 10*3/uL (ref 150–450)
RBC: 4.62 x10E6/uL (ref 4.14–5.80)
RDW: 13 % (ref 11.6–15.4)
WBC: 7.7 10*3/uL (ref 3.4–10.8)

## 2021-12-31 LAB — CMP14+EGFR
ALT: 27 IU/L (ref 0–44)
AST: 21 IU/L (ref 0–40)
Albumin/Globulin Ratio: 2.2 (ref 1.2–2.2)
Albumin: 5 g/dL — ABNORMAL HIGH (ref 3.8–4.9)
Alkaline Phosphatase: 55 IU/L (ref 44–121)
BUN/Creatinine Ratio: 20 (ref 9–20)
BUN: 19 mg/dL (ref 6–24)
Bilirubin Total: 0.5 mg/dL (ref 0.0–1.2)
CO2: 24 mmol/L (ref 20–29)
Calcium: 9.9 mg/dL (ref 8.7–10.2)
Chloride: 102 mmol/L (ref 96–106)
Creatinine, Ser: 0.96 mg/dL (ref 0.76–1.27)
Globulin, Total: 2.3 g/dL (ref 1.5–4.5)
Glucose: 87 mg/dL (ref 70–99)
Potassium: 4.9 mmol/L (ref 3.5–5.2)
Sodium: 142 mmol/L (ref 134–144)
Total Protein: 7.3 g/dL (ref 6.0–8.5)
eGFR: 95 mL/min/{1.73_m2} (ref >59)

## 2021-12-31 LAB — PSA, TOTAL AND FREE
PSA, Free Pct: 23 %
PSA, Free: 0.23 ng/mL
Prostate Specific Ag, Serum: 1 ng/mL (ref 0.0–4.0)

## 2021-12-31 LAB — LIPID PANEL
Chol/HDL Ratio: 4.1 ratio (ref 0.0–5.0)
Cholesterol, Total: 215 mg/dL — ABNORMAL HIGH (ref 100–199)
HDL: 53 mg/dL (ref >39)
LDL Chol Calc (NIH): 121 mg/dL — ABNORMAL HIGH (ref 0–99)
Triglycerides: 234 mg/dL — ABNORMAL HIGH (ref 0–149)
VLDL Cholesterol Cal: 41 mg/dL — ABNORMAL HIGH (ref 5–40)

## 2022-02-24 ENCOUNTER — Encounter: Payer: Self-pay | Admitting: Family Medicine

## 2022-02-24 ENCOUNTER — Ambulatory Visit (INDEPENDENT_AMBULATORY_CARE_PROVIDER_SITE_OTHER): Payer: BC Managed Care – PPO | Admitting: Family Medicine

## 2022-02-24 ENCOUNTER — Ambulatory Visit (INDEPENDENT_AMBULATORY_CARE_PROVIDER_SITE_OTHER): Payer: BC Managed Care – PPO

## 2022-02-24 VITALS — BP 137/90 | HR 85 | Ht 73.0 in | Wt 240.0 lb

## 2022-02-24 DIAGNOSIS — M25522 Pain in left elbow: Secondary | ICD-10-CM | POA: Diagnosis not present

## 2022-02-24 MED ORDER — DICLOFENAC SODIUM 75 MG PO TBEC
75.0000 mg | DELAYED_RELEASE_TABLET | Freq: Two times a day (BID) | ORAL | 0 refills | Status: DC
Start: 2022-02-24 — End: 2022-07-15

## 2022-02-24 NOTE — Progress Notes (Signed)
? ?  Assessment & Plan:  ?1. Left elbow pain ?Advised against use of other NSAIDs while taking Diclofenac. May use Tylenol if needed between doses of Diclofenac. X-ray results pending.  ?- DG Elbow 2 Views Left; Future ?- diclofenac (VOLTAREN) 75 MG EC tablet; Take 1 tablet (75 mg total) by mouth 2 (two) times daily.  Dispense: 60 tablet; Refill: 0 ? ? ?Follow up plan: Return if symptoms worsen or fail to improve. ? ?Hendricks Limes, MSN, APRN, FNP-C ?[place] ? ?Subjective:  ? ?Patient ID: Jacob Wood, male    DOB: 1966/01/31, 56 y.o.   MRN: [id number] ? ?HPI: ?Jacob Wood is a 56 y.o. male presenting on 02/24/2022 for Elbow Pain (Left. Hit on forklift.) ? ?Patient reports left elbow pain that started two days ago after he hit it on a forklift. Pushing and pulling makes the pain worse. He has been taking Ibuprofen 400 mg for pain.  ? ? ?ROS: Negative unless specifically indicated above in HPI.  ? ?Relevant past medical history reviewed and updated as indicated.  ? ?Allergies and medications reviewed and updated. ? ? ?Current Outpatient Medications:  ?  losartan (COZAAR) 50 MG tablet, Take 1 tablet (50 mg total) by mouth daily., Disp: 90 tablet, Rfl: 3 ?  Melatonin 5 MG CAPS, Take 5 mg by mouth as needed., Disp: , Rfl:  ?  Multiple Vitamin (MULTIVITAMIN) capsule, Take 1 capsule by mouth daily., Disp: , Rfl:  ?  Omega-3 Fatty Acids (FISH OIL) 1000 MG CAPS, Take by mouth., Disp: , Rfl:  ?  rosuvastatin (CRESTOR) 40 MG tablet, Take 1 tablet (40 mg total) by mouth daily., Disp: 90 tablet, Rfl: 3 ? ?No Known Allergies ? ?Objective:  ? ?BP 137/90   Pulse 85   Ht '6\' 1"'$  (1.854 m)   Wt 240 lb (108.9 kg)   SpO2 97%   BMI 31.66 kg/m?   ? ?Physical Exam ?Vitals reviewed.  ?Constitutional:   ?   General: He is not in acute distress. ?   Appearance: Normal appearance. He is not ill-appearing, toxic-appearing or diaphoretic.  ?HENT:  ?   Head: Normocephalic and atraumatic.  ?Eyes:  ?   General: No  scleral icterus.    ?   Right eye: No discharge.     ?   Left eye: No discharge.  ?   Conjunctiva/sclera: Conjunctivae normal.  ?Cardiovascular:  ?   Rate and Rhythm: Normal rate.  ?Pulmonary:  ?   Effort: Pulmonary effort is normal. No respiratory distress.  ?Musculoskeletal:     ?   General: Normal range of motion.  ?   Left elbow: No swelling, deformity, effusion or lacerations. Normal range of motion. Tenderness present in olecranon process.  ?   Cervical back: Normal range of motion.  ?Skin: ?   General: Skin is warm and dry.  ?Neurological:  ?   Mental Status: He is alert and oriented to person, place, and time. Mental status is at baseline.  ?Psychiatric:     ?   Mood and Affect: Mood normal.     ?   Behavior: Behavior normal.     ?   Thought Content: Thought content normal.     ?   Judgment: Judgment normal.  ? ? ? ? ? ? ?

## 2022-07-02 ENCOUNTER — Ambulatory Visit: Payer: BC Managed Care – PPO | Admitting: Family Medicine

## 2022-07-15 ENCOUNTER — Encounter: Payer: Self-pay | Admitting: Family Medicine

## 2022-07-15 ENCOUNTER — Ambulatory Visit (INDEPENDENT_AMBULATORY_CARE_PROVIDER_SITE_OTHER): Payer: BC Managed Care – PPO | Admitting: Family Medicine

## 2022-07-15 VITALS — BP 138/85 | HR 79 | Temp 98.0°F | Ht 73.0 in | Wt 239.0 lb

## 2022-07-15 DIAGNOSIS — E782 Mixed hyperlipidemia: Secondary | ICD-10-CM

## 2022-07-15 DIAGNOSIS — I10 Essential (primary) hypertension: Secondary | ICD-10-CM | POA: Diagnosis not present

## 2022-07-15 DIAGNOSIS — Z23 Encounter for immunization: Secondary | ICD-10-CM | POA: Diagnosis not present

## 2022-07-15 MED ORDER — LOSARTAN POTASSIUM 50 MG PO TABS: 50.0000 mg | ORAL_TABLET | Freq: Every day | ORAL | 3 refills | Status: DC

## 2022-07-15 NOTE — Progress Notes (Signed)
BP 138/85   Pulse 79   Temp 98 F (36.7 C)   Ht 6' 1"  (1.854 m)   Wt 239 lb (108.4 kg)   SpO2 97%   BMI 31.53 kg/m    Subjective:   Patient ID: Jacob Wood, male    DOB: [birth year], 56 y.o.   MRN: [id number]  HPI: Jacob Wood is a 56 y.o. male presenting on 07/15/2022 for Medical Management of Chronic Issues, Hyperlipidemia, and Hypertension   HPI Hyperlipidemia Patient is coming in for recheck of his hyperlipidemia. The patient is currently taking Crestor. They deny any issues with myalgias or history of liver damage from it. They deny any focal numbness or weakness or chest pain.   Hypertension Patient is currently on losartan, and their blood pressure today is 138/85. Patient denies any lightheadedness or dizziness. Patient denies headaches, blurred vision, chest pains, shortness of breath, or weakness. Denies any side effects from medication and is content with current medication.   Relevant past medical, surgical, family and social history reviewed and updated as indicated. Interim medical history since our last visit reviewed. Allergies and medications reviewed and updated.  Review of Systems  Constitutional:  Negative for chills and fever.  Eyes:  Negative for visual disturbance.  Respiratory:  Negative for shortness of breath and wheezing.   Cardiovascular:  Negative for chest pain and leg swelling.  Musculoskeletal:  Negative for back pain and gait problem.  Skin:  Negative for rash.  Neurological:  Negative for dizziness, weakness and light-headedness.  All other systems reviewed and are negative.   Per HPI unless specifically indicated above   Allergies as of 07/15/2022   No Known Allergies      Medication List        Accurate as of July 15, 2022  1:53 PM. If you have any questions, ask your nurse or doctor.          STOP taking these medications    diclofenac 75 MG EC tablet Commonly known as: VOLTAREN Stopped by: Fransisca Kaufmann  Merritt Kibby, MD   Melatonin 5 MG Caps Stopped by: Fransisca Kaufmann Herron Fero, MD       TAKE these medications    Fish Oil 1000 MG Caps Take by mouth.   losartan 50 MG tablet Commonly known as: COZAAR Take 1 tablet (50 mg total) by mouth daily.   multivitamin capsule Take 1 capsule by mouth daily.   rosuvastatin 40 MG tablet Commonly known as: Crestor Take 1 tablet (40 mg total) by mouth daily.         Objective:   BP 138/85   Pulse 79   Temp 98 F (36.7 C)   Ht 6' 1"  (1.854 m)   Wt 239 lb (108.4 kg)   SpO2 97%   BMI 31.53 kg/m   Wt Readings from Last 3 Encounters:  07/15/22 239 lb (108.4 kg)  02/24/22 240 lb (108.9 kg)  12/30/21 236 lb (107 kg)    Physical Exam Vitals and nursing note reviewed.  Constitutional:      General: He is not in acute distress.    Appearance: He is well-developed. He is not diaphoretic.  Eyes:     General: No scleral icterus.    Conjunctiva/sclera: Conjunctivae normal.  Neck:     Thyroid: No thyromegaly.  Cardiovascular:     Rate and Rhythm: Normal rate and regular rhythm.     Heart sounds: Normal heart sounds. No murmur heard. Pulmonary:     Effort: Pulmonary effort  is normal. No respiratory distress.     Breath sounds: Normal breath sounds. No wheezing.  Musculoskeletal:        General: No swelling. Normal range of motion.     Cervical back: Neck supple.  Lymphadenopathy:     Cervical: No cervical adenopathy.  Skin:    General: Skin is warm and dry.     Findings: No rash.  Neurological:     Mental Status: He is alert and oriented to person, place, and time.     Coordination: Coordination normal.  Psychiatric:        Behavior: Behavior normal.       Assessment & Plan:   Problem List Items Addressed This Visit       Cardiovascular and Mediastinum   Essential hypertension   Relevant Medications   losartan (COZAAR) 50 MG tablet   Other Relevant Orders   CBC with Differential/Platelet     Other   Hyperlipidemia -  Primary   Relevant Medications   losartan (COZAAR) 50 MG tablet   Other Relevant Orders   CBC with Differential/Platelet   CMP14+EGFR   Lipid panel    Blood pressure looks good, heart rate looks good, continue to monitor, Follow up plan: Return in about 6 months (around 01/13/2023), or if symptoms worsen or fail to improve, for Hyperlipidemia and hypertension.  Counseling provided for all of the vaccine components Orders Placed This Encounter  Procedures   CBC with Differential/Platelet   CMP14+EGFR   Lipid panel    Caryl Pina, MD [facility] 07/15/2022, 1:53 PM

## 2022-07-16 LAB — CBC WITH DIFFERENTIAL/PLATELET
Basophils Absolute: 0 10*3/uL (ref 0.0–0.2)
Basos: 1 %
EOS (ABSOLUTE): 0.1 10*3/uL (ref 0.0–0.4)
Eos: 1 %
Hematocrit: 43.4 % (ref 37.5–51.0)
Hemoglobin: 15 g/dL (ref 13.0–17.7)
Immature Grans (Abs): 0 10*3/uL (ref 0.0–0.1)
Immature Granulocytes: 0 %
Lymphocytes Absolute: 1.4 10*3/uL (ref 0.7–3.1)
Lymphs: 21 %
MCH: 32.1 pg (ref 26.6–33.0)
MCHC: 34.6 g/dL (ref 31.5–35.7)
MCV: 93 fL (ref 79–97)
Monocytes Absolute: 0.4 10*3/uL (ref 0.1–0.9)
Monocytes: 6 %
Neutrophils Absolute: 5 10*3/uL (ref 1.4–7.0)
Neutrophils: 71 %
Platelets: 189 10*3/uL (ref 150–450)
RBC: 4.68 x10E6/uL (ref 4.14–5.80)
RDW: 12.6 % (ref 11.6–15.4)
WBC: 6.9 10*3/uL (ref 3.4–10.8)

## 2022-07-16 LAB — CMP14+EGFR
ALT: 35 IU/L (ref 0–44)
AST: 30 IU/L (ref 0–40)
Albumin/Globulin Ratio: 2.3 — ABNORMAL HIGH (ref 1.2–2.2)
Albumin: 4.9 g/dL (ref 3.8–4.9)
Alkaline Phosphatase: 51 IU/L (ref 44–121)
BUN/Creatinine Ratio: 16 (ref 9–20)
BUN: 12 mg/dL (ref 6–24)
Bilirubin Total: 0.5 mg/dL (ref 0.0–1.2)
CO2: 25 mmol/L (ref 20–29)
Calcium: 9.9 mg/dL (ref 8.7–10.2)
Chloride: 100 mmol/L (ref 96–106)
Creatinine, Ser: 0.77 mg/dL (ref 0.76–1.27)
Globulin, Total: 2.1 g/dL (ref 1.5–4.5)
Glucose: 93 mg/dL (ref 70–99)
Potassium: 4.8 mmol/L (ref 3.5–5.2)
Sodium: 142 mmol/L (ref 134–144)
Total Protein: 7 g/dL (ref 6.0–8.5)
eGFR: 107 mL/min/{1.73_m2} (ref >59)

## 2022-07-16 LAB — LIPID PANEL
Chol/HDL Ratio: 3.8 ratio (ref 0.0–5.0)
Cholesterol, Total: 170 mg/dL (ref 100–199)
HDL: 45 mg/dL (ref >39)
LDL Chol Calc (NIH): 95 mg/dL (ref 0–99)
Triglycerides: 175 mg/dL — ABNORMAL HIGH (ref 0–149)
VLDL Cholesterol Cal: 30 mg/dL (ref 5–40)

## 2022-08-13 IMAGING — DX DG ELBOW 2V*L*
2 series · 2 of 2 positions shown · non-contrast
Comparison: None.

CLINICAL DATA: Left elbow pain.

EXAM:
LEFT ELBOW - 2 VIEW

[elbow ap (1 of 2)]
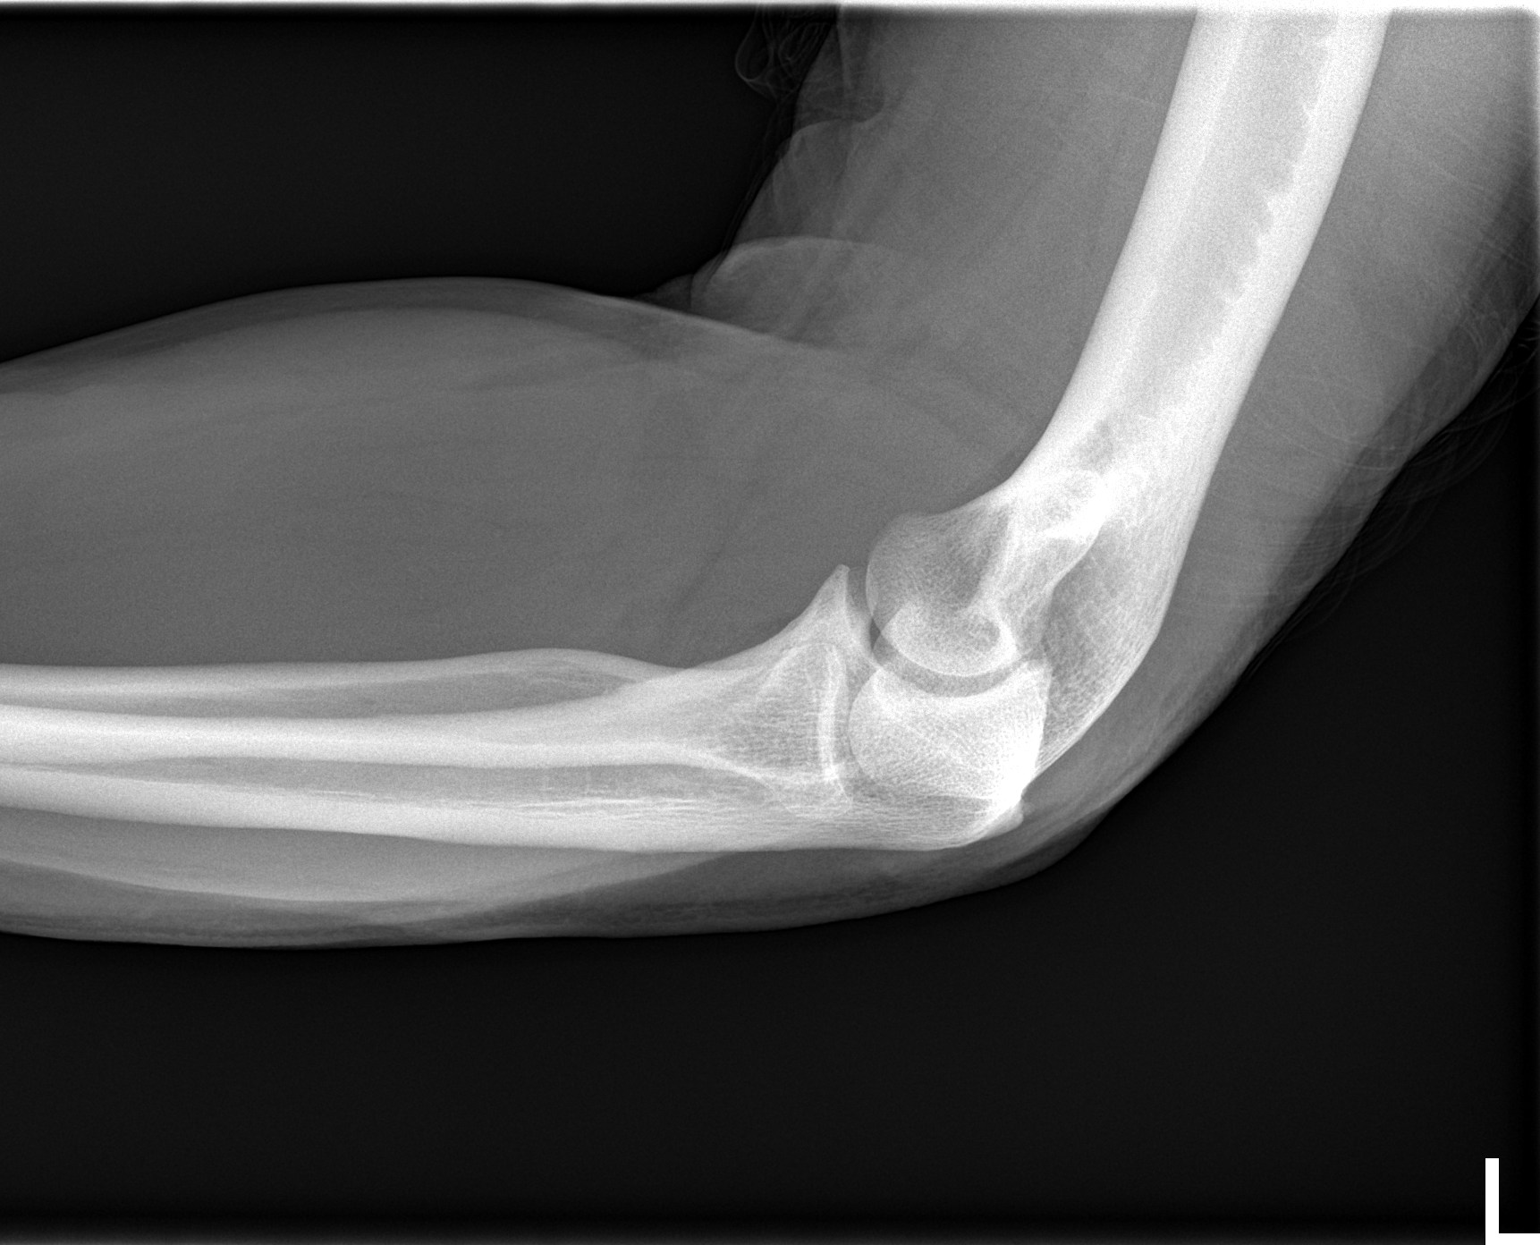

[elbow ap (2 of 2)]
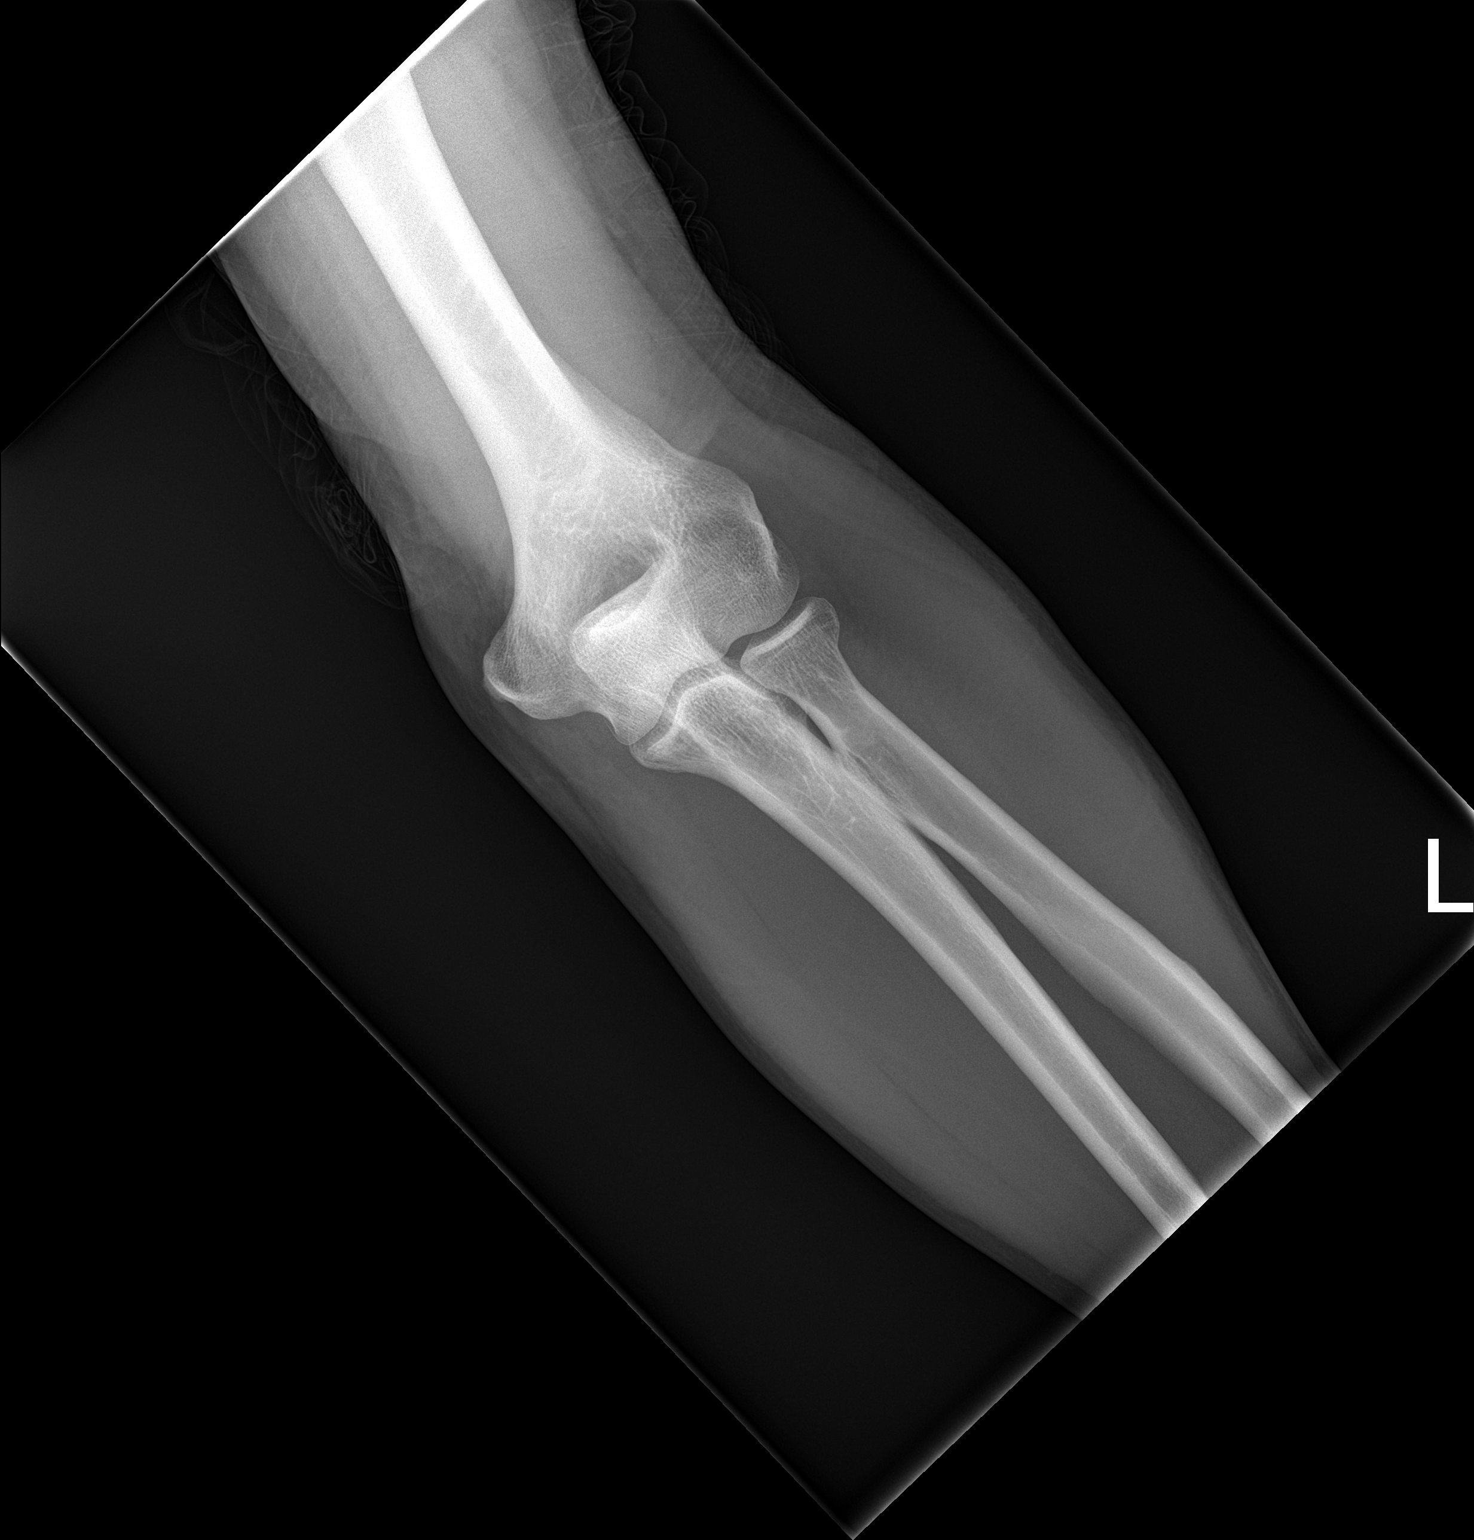

[2 of 2 positions shown; findings below may reference images not displayed]

FINDINGS: There is no evidence of fracture, dislocation, or joint effusion.
There is no evidence of arthropathy or other focal bone abnormality.
Soft tissues are unremarkable.
IMPRESSION: Negative.

## 2023-01-13 ENCOUNTER — Ambulatory Visit (INDEPENDENT_AMBULATORY_CARE_PROVIDER_SITE_OTHER): Payer: BC Managed Care – PPO | Admitting: Family Medicine

## 2023-01-13 ENCOUNTER — Encounter: Payer: Self-pay | Admitting: Family Medicine

## 2023-01-13 VITALS — BP 119/72 | HR 70 | Ht 73.0 in | Wt 243.0 lb

## 2023-01-13 DIAGNOSIS — I10 Essential (primary) hypertension: Secondary | ICD-10-CM | POA: Diagnosis not present

## 2023-01-13 DIAGNOSIS — E782 Mixed hyperlipidemia: Secondary | ICD-10-CM | POA: Diagnosis not present

## 2023-01-13 DIAGNOSIS — E669 Obesity, unspecified: Secondary | ICD-10-CM | POA: Diagnosis not present

## 2023-01-13 DIAGNOSIS — Z6832 Body mass index (BMI) 32.0-32.9, adult: Secondary | ICD-10-CM

## 2023-01-13 DIAGNOSIS — Z125 Encounter for screening for malignant neoplasm of prostate: Secondary | ICD-10-CM

## 2023-01-13 MED ORDER — ROSUVASTATIN CALCIUM 40 MG PO TABS: 40.0000 mg | ORAL_TABLET | Freq: Every day | ORAL | 3 refills | Status: DC

## 2023-01-13 NOTE — Progress Notes (Signed)
BP 119/72   Pulse 70   Ht '6\' 1"'$  (1.854 m)   Wt 243 lb (110.2 kg)   SpO2 97%   BMI 32.06 kg/m    Subjective:   Patient ID: Jacob Wood, male    DOB: 23-Jun-1966, 57 y.o.   MRN: [id number]  HPI: Jacob Wood is a 57 y.o. male presenting on 01/13/2023 for Medical Management of Chronic Issues, Hyperlipidemia, and Hypertension   HPI Hypertension Patient is currently on losartan, and their blood pressure today is 119/72. Patient denies any lightheadedness or dizziness. Patient denies headaches, blurred vision, chest pains, shortness of breath, or weakness. Denies any side effects from medication and is content with current medication.   Hyperlipidemia Patient is coming in for recheck of his hyperlipidemia. The patient is currently taking Crestor and fish oils. They deny any issues with myalgias or history of liver damage from it. They deny any focal numbness or weakness or chest pain.   Obesity recheck Patient is coming in today for obesity and weight recheck.  He is about the same as he was last time.  Discussed healthy weight and lifestyle changes  Relevant past medical, surgical, family and social history reviewed and updated as indicated. Interim medical history since our last visit reviewed. Allergies and medications reviewed and updated.  Review of Systems  Constitutional:  Negative for chills and fever.  Eyes:  Negative for visual disturbance.  Respiratory:  Negative for shortness of breath and wheezing.   Cardiovascular:  Negative for chest pain and leg swelling.  Musculoskeletal:  Negative for back pain and gait problem.  Skin:  Negative for rash.  Neurological:  Negative for dizziness, weakness and light-headedness.  All other systems reviewed and are negative.   Per HPI unless specifically indicated above   Allergies as of 01/13/2023   No Known Allergies      Medication List        Accurate as of January 13, 2023  1:28 PM. If you have any questions, ask  your nurse or doctor.          Fish Oil 1000 MG Caps Take by mouth.   losartan 50 MG tablet Commonly known as: COZAAR Take 1 tablet (50 mg total) by mouth daily.   multivitamin capsule Take 1 capsule by mouth daily.   rosuvastatin 40 MG tablet Commonly known as: Crestor Take 1 tablet (40 mg total) by mouth daily.         Objective:   BP 119/72   Pulse 70   Ht '6\' 1"'$  (1.854 m)   Wt 243 lb (110.2 kg)   SpO2 97%   BMI 32.06 kg/m   Wt Readings from Last 3 Encounters:  01/13/23 243 lb (110.2 kg)  07/15/22 239 lb (108.4 kg)  02/24/22 240 lb (108.9 kg)    Physical Exam Vitals and nursing note reviewed.  Constitutional:      General: He is not in acute distress.    Appearance: He is well-developed. He is not diaphoretic.  Eyes:     General: No scleral icterus.    Conjunctiva/sclera: Conjunctivae normal.  Neck:     Thyroid: No thyromegaly.  Cardiovascular:     Rate and Rhythm: Normal rate and regular rhythm.     Heart sounds: Normal heart sounds. No murmur heard. Pulmonary:     Effort: Pulmonary effort is normal. No respiratory distress.     Breath sounds: Normal breath sounds. No wheezing.  Musculoskeletal:        General:  No swelling. Normal range of motion.     Cervical back: Neck supple.  Lymphadenopathy:     Cervical: No cervical adenopathy.  Skin:    General: Skin is warm and dry.     Findings: No rash.  Neurological:     Mental Status: He is alert and oriented to person, place, and time.     Coordination: Coordination normal.  Psychiatric:        Behavior: Behavior normal.       Assessment & Plan:   Problem List Items Addressed This Visit       Cardiovascular and Mediastinum   Essential hypertension - Primary   Relevant Medications   rosuvastatin (CRESTOR) 40 MG tablet   Other Relevant Orders   CBC with Differential/Platelet   CMP14+EGFR     Other   Hyperlipidemia   Relevant Medications   rosuvastatin (CRESTOR) 40 MG tablet    Other Relevant Orders   Lipid panel   Obesity (BMI 30.0-34.9)   Other Visit Diagnoses     Prostate cancer screening       Relevant Orders   PSA, total and free       Will check blood work today, blood pressure and everything looks good.  No changes  He has been working out and doing lifting weights so encouraged to keep going with that. Follow up plan: Return in about 6 months (around 07/16/2023), or if symptoms worsen or fail to improve, for Hypertension and hyperlipidemia.  Counseling provided for all of the vaccine components Orders Placed This Encounter  Procedures   CBC with Differential/Platelet   CMP14+EGFR   Lipid panel   PSA, total and free    Caryl Pina, MD [facility] 01/13/2023, 1:28 PM

## 2023-01-14 LAB — CBC WITH DIFFERENTIAL/PLATELET
Basophils Absolute: 0 10*3/uL (ref 0.0–0.2)
Basos: 1 %
EOS (ABSOLUTE): 0.1 10*3/uL (ref 0.0–0.4)
Eos: 1 %
Hematocrit: 42 % (ref 37.5–51.0)
Hemoglobin: 14.1 g/dL (ref 13.0–17.7)
Immature Grans (Abs): 0 10*3/uL (ref 0.0–0.1)
Immature Granulocytes: 0 %
Lymphocytes Absolute: 1.6 10*3/uL (ref 0.7–3.1)
Lymphs: 25 %
MCH: 31.2 pg (ref 26.6–33.0)
MCHC: 33.6 g/dL (ref 31.5–35.7)
MCV: 93 fL (ref 79–97)
Monocytes Absolute: 0.5 10*3/uL (ref 0.1–0.9)
Monocytes: 8 %
Neutrophils Absolute: 4.2 10*3/uL (ref 1.4–7.0)
Neutrophils: 65 %
Platelets: 204 10*3/uL (ref 150–450)
RBC: 4.52 x10E6/uL (ref 4.14–5.80)
RDW: 12.8 % (ref 11.6–15.4)
WBC: 6.5 10*3/uL (ref 3.4–10.8)

## 2023-01-14 LAB — LIPID PANEL
Chol/HDL Ratio: 3.7 ratio (ref 0.0–5.0)
Cholesterol, Total: 168 mg/dL (ref 100–199)
HDL: 45 mg/dL (ref >39)
LDL Chol Calc (NIH): 92 mg/dL (ref 0–99)
Triglycerides: 181 mg/dL — ABNORMAL HIGH (ref 0–149)
VLDL Cholesterol Cal: 31 mg/dL (ref 5–40)

## 2023-01-14 LAB — CMP14+EGFR
ALT: 44 IU/L (ref 0–44)
AST: 39 IU/L (ref 0–40)
Albumin/Globulin Ratio: 2.4 — ABNORMAL HIGH (ref 1.2–2.2)
Albumin: 4.7 g/dL (ref 3.8–4.9)
Alkaline Phosphatase: 52 IU/L (ref 44–121)
BUN/Creatinine Ratio: 13 (ref 9–20)
BUN: 13 mg/dL (ref 6–24)
Bilirubin Total: 0.5 mg/dL (ref 0.0–1.2)
CO2: 21 mmol/L (ref 20–29)
Calcium: 9.5 mg/dL (ref 8.7–10.2)
Chloride: 104 mmol/L (ref 96–106)
Creatinine, Ser: 1.02 mg/dL (ref 0.76–1.27)
Globulin, Total: 2 g/dL (ref 1.5–4.5)
Glucose: 101 mg/dL — ABNORMAL HIGH (ref 70–99)
Potassium: 4.6 mmol/L (ref 3.5–5.2)
Sodium: 143 mmol/L (ref 134–144)
Total Protein: 6.7 g/dL (ref 6.0–8.5)
eGFR: 87 mL/min/{1.73_m2} (ref >59)

## 2023-01-14 LAB — PSA, TOTAL AND FREE
PSA, Free Pct: 26.4 %
PSA, Free: 0.29 ng/mL
Prostate Specific Ag, Serum: 1.1 ng/mL (ref 0.0–4.0)

## 2023-02-10 ENCOUNTER — Encounter: Payer: Self-pay | Admitting: Family Medicine

## 2023-03-10 ENCOUNTER — Encounter: Payer: Self-pay | Admitting: Family Medicine

## 2023-07-12 ENCOUNTER — Ambulatory Visit (INDEPENDENT_AMBULATORY_CARE_PROVIDER_SITE_OTHER): Payer: BC Managed Care – PPO

## 2023-07-12 ENCOUNTER — Ambulatory Visit (INDEPENDENT_AMBULATORY_CARE_PROVIDER_SITE_OTHER): Payer: BC Managed Care – PPO | Admitting: Family

## 2023-07-12 ENCOUNTER — Encounter: Payer: Self-pay | Admitting: Family

## 2023-07-12 VITALS — BP 128/79 | HR 77 | Temp 97.9°F | Ht 73.0 in | Wt 242.6 lb

## 2023-07-12 DIAGNOSIS — K5900 Constipation, unspecified: Secondary | ICD-10-CM

## 2023-07-12 DIAGNOSIS — R1032 Left lower quadrant pain: Secondary | ICD-10-CM

## 2023-07-12 DIAGNOSIS — Z23 Encounter for immunization: Secondary | ICD-10-CM

## 2023-07-12 NOTE — Progress Notes (Signed)
Subjective:    Patient ID: Jacob Wood, male    DOB: 20-Jan-1966, 57 y.o.   MRN: [id number]  Chief Complaint  Patient presents with   Flank Pain    Right side since Friday night. Feels like he is been getting kicked in the groin gas pain that intensify and then goes back bown. Flu shot today    PT presents to the office today with left sided abdominal pain that started Friday.  Flank Pain Associated symptoms include abdominal pain. Pertinent negatives include no dysuria, fever or headaches.  Abdominal Pain This is a new problem. The current episode started in the past 7 days. The problem occurs constantly. The problem has been unchanged. The pain is located in the LLQ. The pain is at a severity of 4/10 (it can peak 5-6). The pain is mild. The quality of the pain is aching and cramping. Pain radiation: left groin. Associated symptoms include flatus. Pertinent negatives include no belching, constipation, diarrhea, dysuria, fever, frequency, headaches, hematuria, nausea or vomiting. The pain is aggravated by palpation and movement. The pain is relieved by Being still. Treatments tried: aleve. The treatment provided mild relief.      Review of Systems  Constitutional:  Negative for fever.  Gastrointestinal:  Positive for abdominal pain and flatus. Negative for constipation, diarrhea, nausea and vomiting.  Genitourinary:  Positive for flank pain. Negative for dysuria, frequency and hematuria.  Neurological:  Negative for headaches.  All other systems reviewed and are negative.      Objective:   Physical Exam Vitals reviewed.  Constitutional:      General: He is not in acute distress.    Appearance: He is well-developed.  HENT:     Head: Normocephalic.  Eyes:     General:        Right eye: No discharge.        Left eye: No discharge.     Pupils: Pupils are equal, round, and reactive to light.  Neck:     Thyroid: No thyromegaly.  Cardiovascular:     Rate and Rhythm: Normal  rate and regular rhythm.     Heart sounds: Normal heart sounds. No murmur heard. Pulmonary:     Effort: Pulmonary effort is normal. No respiratory distress.     Breath sounds: Normal breath sounds. No wheezing.  Abdominal:     General: Bowel sounds are normal. There is no distension.     Palpations: Abdomen is soft.     Tenderness: There is no abdominal tenderness.       Comments: Tenderness Left abdomen  Musculoskeletal:        General: No tenderness. Normal range of motion.     Cervical back: Normal range of motion and neck supple.  Skin:    General: Skin is warm and dry.     Findings: No erythema or rash.  Neurological:     Mental Status: He is alert and oriented to person, place, and time.     Cranial Nerves: No cranial nerve deficit.     Deep Tendon Reflexes: Reflexes are normal and symmetric.  Psychiatric:        Behavior: Behavior normal.        Thought Content: Thought content normal.        Judgment: Judgment normal.   KUB- Large amount of stool in colon, Preliminary reading by Jannifer Rodney, FNP [facility]   BP 128/79   Pulse 77   Temp 97.9 F (36.6 C) (Temporal)   Ht  6\' 1"  (1.854 m)   Wt 242 lb 9.6 oz (110 kg)   SpO2 95%   BMI 32.01 kg/m       Assessment & Plan:  Jacob Wood comes in today with chief complaint of Flank Pain (Left side since Friday night. Feels like he is been getting kicked in the groin gas pain that intensify and then goes back bown. Flu shot today )   Diagnosis and orders addressed:  1. Encounter for immunization - Flu vaccine trivalent PF, 6mos and older(Flulaval,Afluria,Fluarix,Fluzone)  2. Left lower quadrant abdominal pain - DG Abd 1 View - CBC with Differential/Platelet - CMP14+EGFR  3. Constipation, unspecified constipation type   Labs pending Pt will use Magnesium citrate to have a good clean out. If pain continues will need CT scan.  Force fluids Follow up if symptoms worsen or do not improve    Jannifer Rodney, FNP

## 2023-07-12 NOTE — Patient Instructions (Signed)

## 2023-07-13 ENCOUNTER — Telehealth: Payer: Self-pay | Admitting: Family Medicine

## 2023-07-13 DIAGNOSIS — N200 Calculus of kidney: Secondary | ICD-10-CM

## 2023-07-13 LAB — CBC WITH DIFFERENTIAL/PLATELET
Basophils Absolute: 0 10*3/uL (ref 0.0–0.2)
Basos: 0 %
EOS (ABSOLUTE): 0.2 10*3/uL (ref 0.0–0.4)
Eos: 1 %
Hematocrit: 45.8 % (ref 37.5–51.0)
Hemoglobin: 14.8 g/dL (ref 13.0–17.7)
Immature Grans (Abs): 0 10*3/uL (ref 0.0–0.1)
Immature Granulocytes: 0 %
Lymphocytes Absolute: 1.5 10*3/uL (ref 0.7–3.1)
Lymphs: 13 %
MCH: 31 pg (ref 26.6–33.0)
MCHC: 32.3 g/dL (ref 31.5–35.7)
MCV: 96 fL (ref 79–97)
Monocytes Absolute: 0.8 10*3/uL (ref 0.1–0.9)
Monocytes: 7 %
Neutrophils Absolute: 9 10*3/uL — ABNORMAL HIGH (ref 1.4–7.0)
Neutrophils: 79 %
Platelets: 208 10*3/uL (ref 150–450)
RBC: 4.77 x10E6/uL (ref 4.14–5.80)
RDW: 12.5 % (ref 11.6–15.4)
WBC: 11.5 10*3/uL — ABNORMAL HIGH (ref 3.4–10.8)

## 2023-07-13 LAB — CMP14+EGFR
ALT: 25 IU/L (ref 0–44)
AST: 22 IU/L (ref 0–40)
Albumin: 4.7 g/dL (ref 3.8–4.9)
Alkaline Phosphatase: 63 IU/L (ref 44–121)
BUN/Creatinine Ratio: 13 (ref 9–20)
BUN: 15 mg/dL (ref 6–24)
Bilirubin Total: 0.5 mg/dL (ref 0.0–1.2)
CO2: 24 mmol/L (ref 20–29)
Calcium: 9.8 mg/dL (ref 8.7–10.2)
Chloride: 101 mmol/L (ref 96–106)
Creatinine, Ser: 1.13 mg/dL (ref 0.76–1.27)
Globulin, Total: 2.4 g/dL (ref 1.5–4.5)
Glucose: 99 mg/dL (ref 70–99)
Potassium: 4.9 mmol/L (ref 3.5–5.2)
Sodium: 140 mmol/L (ref 134–144)
Total Protein: 7.1 g/dL (ref 6.0–8.5)
eGFR: 77 mL/min/{1.73_m2} (ref >59)

## 2023-07-13 MED ORDER — HYDROCODONE-ACETAMINOPHEN 5-325 MG PO TABS: 1.0000 | ORAL_TABLET | Freq: Four times a day (QID) | ORAL | 0 refills | Status: DC | PRN

## 2023-07-13 MED ORDER — TAMSULOSIN HCL 0.4 MG PO CAPS: 0.4000 mg | ORAL_CAPSULE | Freq: Every day | ORAL | 3 refills | Status: DC

## 2023-07-13 NOTE — Telephone Encounter (Signed)
X-ray does look like it showed a kidney stone which could be a possible source of his pain.  Sent a pain pill that he can use as needed and also sent Flomax which should help flush out the kidney stone.  This kidney stone should pass on its own, he should also drink plenty of fluids to make sure that he helps that passed on his own.  If anything worsens please let us know

## 2023-07-13 NOTE — Telephone Encounter (Signed)
Patient called earlier this morning and hadn't heard anything back. Hurts worse today than yesterday. Please call.

## 2023-07-13 NOTE — Telephone Encounter (Signed)
LLQ pain  Xray looked like a kidney stone?  5-6 pain level. Worse when taking deep breath.  No pain or bleeding with urination.  Does not have a history of kidney stones.  Having normal BM's  Neysa Bonito thought he was constipated. Given Mag and pt did have large BM. Not dark, did not see any blood.  Pain was not better after the BM

## 2023-07-13 NOTE — Telephone Encounter (Signed)
Pt called stating that he had a visit with Christy yesterday and needs to tell her that he is feeling worse today and needs advise on what to do.

## 2023-07-13 NOTE — Telephone Encounter (Signed)
Pt made aware of Dr. Darrol Poke recommendations. He will call back if needed.

## 2023-07-18 ENCOUNTER — Ambulatory Visit: Payer: BC Managed Care – PPO

## 2023-07-20 ENCOUNTER — Ambulatory Visit (INDEPENDENT_AMBULATORY_CARE_PROVIDER_SITE_OTHER): Payer: BC Managed Care – PPO | Admitting: Family Medicine

## 2023-07-20 ENCOUNTER — Encounter: Payer: Self-pay | Admitting: Family Medicine

## 2023-07-20 VITALS — BP 130/86 | HR 66 | Temp 98.1°F | Ht 73.0 in | Wt 241.8 lb

## 2023-07-20 DIAGNOSIS — N200 Calculus of kidney: Secondary | ICD-10-CM | POA: Diagnosis not present

## 2023-07-20 DIAGNOSIS — E669 Obesity, unspecified: Secondary | ICD-10-CM

## 2023-07-20 DIAGNOSIS — I10 Essential (primary) hypertension: Secondary | ICD-10-CM | POA: Diagnosis not present

## 2023-07-20 DIAGNOSIS — E782 Mixed hyperlipidemia: Secondary | ICD-10-CM | POA: Diagnosis not present

## 2023-07-20 DIAGNOSIS — E66811 Obesity, class 1: Secondary | ICD-10-CM

## 2023-07-20 MED ORDER — HYDROCODONE-ACETAMINOPHEN 5-325 MG PO TABS: 1.0000 | ORAL_TABLET | Freq: Four times a day (QID) | ORAL | 0 refills | Status: AC | PRN

## 2023-07-20 NOTE — Progress Notes (Signed)
BP 130/86   Pulse 66   Temp 98.1 F (36.7 C) (Temporal)   Ht 6\' 1"  (1.854 m)   Wt 241 lb 12.8 oz (109.7 kg)   SpO2 96%   BMI 31.90 kg/m    Subjective:   Patient ID: Jacob Wood, male    DOB: 11-24-1965, 57 y.o.   MRN: [id number]  HPI: Jacob Wood is a 57 y.o. male presenting on 07/20/2023 for Medical Management of Chronic Issues (6 month)   HPI Hypertension Patient is currently on losartan, and their blood pressure today is 130/86. Patient denies any lightheadedness or dizziness. Patient denies headaches, blurred vision, chest pains, shortness of breath, or weakness. Denies any side effects from medication and is content with current medication.   Hyperlipidemia Patient is coming in for recheck of his hyperlipidemia. The patient is currently taking fish oils and Crestor. They deny any issues with myalgias or history of liver damage from it. They deny any focal numbness or weakness or chest pain.   Patient's hypertension and hyperlipidemia are more complicated by the patient's obesity.  Discussed weight loss and lifestyle modification and exercise with the patient.   Patient is still in a lot of pain in his left groin.  He was diagnosed with a kidney stone over the weekend and has been taking Flomax and pain medicine as needed and trying to hydrate better and he still is having the pain.  He says is not passed and is still quite severe.  Relevant past medical, surgical, family and social history reviewed and updated as indicated. Interim medical history since our last visit reviewed. Allergies and medications reviewed and updated.  Review of Systems  Constitutional:  Negative for chills and fever.  Eyes:  Negative for visual disturbance.  Respiratory:  Negative for shortness of breath and wheezing.   Cardiovascular:  Negative for chest pain and leg swelling.  Gastrointestinal:  Positive for abdominal pain.  Musculoskeletal:  Negative for back pain and gait problem.   Skin:  Negative for rash.  All other systems reviewed and are negative.   Per HPI unless specifically indicated above   Allergies as of 07/20/2023   No Known Allergies      Medication List        Accurate as of July 20, 2023  2:38 PM. If you have any questions, ask your nurse or doctor.          Fish Oil 1000 MG Caps Take by mouth.   HYDROcodone-acetaminophen 5-325 MG tablet Commonly known as: Norco Take 1 tablet by mouth every 6 (six) hours as needed for up to 7 days for moderate pain.   losartan 50 MG tablet Commonly known as: COZAAR Take 1 tablet (50 mg total) by mouth daily.   multivitamin capsule Take 1 capsule by mouth daily.   QC TUMERIC COMPLEX PO Take by mouth.   rosuvastatin 40 MG tablet Commonly known as: Crestor Take 1 tablet (40 mg total) by mouth daily.   tamsulosin 0.4 MG Caps capsule Commonly known as: FLOMAX Take 1 capsule (0.4 mg total) by mouth daily.         Objective:   BP 130/86   Pulse 66   Temp 98.1 F (36.7 C) (Temporal)   Ht 6\' 1"  (1.854 m)   Wt 241 lb 12.8 oz (109.7 kg)   SpO2 96%   BMI 31.90 kg/m   Wt Readings from Last 3 Encounters:  07/20/23 241 lb 12.8 oz (109.7 kg)  07/12/23 242 lb  9.6 oz (110 kg)  01/13/23 243 lb (110.2 kg)    Physical Exam Vitals and nursing note reviewed.  Constitutional:      General: He is not in acute distress.    Appearance: He is well-developed. He is not diaphoretic.  Eyes:     General: No scleral icterus.    Conjunctiva/sclera: Conjunctivae normal.  Neck:     Thyroid: No thyromegaly.  Cardiovascular:     Rate and Rhythm: Normal rate and regular rhythm.     Heart sounds: Normal heart sounds. No murmur heard. Pulmonary:     Effort: Pulmonary effort is normal. No respiratory distress.     Breath sounds: Normal breath sounds. No wheezing.  Abdominal:     General: Abdomen is flat. Bowel sounds are normal. There is no distension.     Tenderness: There is no abdominal  tenderness. There is right CVA tenderness. There is no left CVA tenderness, guarding or rebound.  Musculoskeletal:        General: Normal range of motion.     Cervical back: Neck supple.  Lymphadenopathy:     Cervical: No cervical adenopathy.  Skin:    General: Skin is warm and dry.     Findings: No rash.  Neurological:     Mental Status: He is alert and oriented to person, place, and time.     Coordination: Coordination normal.  Psychiatric:        Behavior: Behavior normal.       Assessment & Plan:   Problem List Items Addressed This Visit       Cardiovascular and Mediastinum   Essential hypertension     Other   Hyperlipidemia - Primary   Relevant Orders   Lipid panel   Obesity (BMI 30.0-34.9)   Other Visit Diagnoses     Renal stone       Relevant Medications   HYDROcodone-acetaminophen (NORCO) 5-325 MG tablet   Kidney stone       Relevant Medications   HYDROcodone-acetaminophen (NORCO) 5-325 MG tablet     Gave refill for the hydrocodone for the kidney stones and continue the Flomax.  Will check his cholesterol panel today.  Follow-up if not improving  Follow up plan: Return in about 6 months (around 01/17/2024), or if symptoms worsen or fail to improve, for Physical exam and hypertension and hyperlipidemia.  Counseling provided for all of the vaccine components Orders Placed This Encounter  Procedures   Lipid panel    Arville Care, MD [facility] 07/20/2023, 2:38 PM

## 2023-07-21 LAB — LIPID PANEL
Chol/HDL Ratio: 3.8 ratio (ref 0.0–5.0)
Cholesterol, Total: 162 mg/dL (ref 100–199)
HDL: 43 mg/dL (ref >39)
LDL Chol Calc (NIH): 88 mg/dL (ref 0–99)
Triglycerides: 179 mg/dL — ABNORMAL HIGH (ref 0–149)
VLDL Cholesterol Cal: 31 mg/dL (ref 5–40)

## 2023-08-02 ENCOUNTER — Other Ambulatory Visit: Payer: Self-pay | Admitting: Family Medicine

## 2023-08-02 DIAGNOSIS — I10 Essential (primary) hypertension: Secondary | ICD-10-CM

## 2023-09-02 ENCOUNTER — Ambulatory Visit: Payer: BC Managed Care – PPO | Admitting: Family Medicine

## 2023-09-02 ENCOUNTER — Ambulatory Visit (HOSPITAL_COMMUNITY)
Admission: RE | Admit: 2023-09-02 | Discharge: 2023-09-02 | Disposition: A | Discharge status: Home / Self Care | Payer: BC Managed Care – PPO | Source: Ambulatory Visit | Attending: Family Medicine | Admitting: Family Medicine

## 2023-09-02 VITALS — BP 124/80 | HR 86 | Temp 98.9°F | Ht 73.0 in | Wt 235.0 lb

## 2023-09-02 DIAGNOSIS — N200 Calculus of kidney: Secondary | ICD-10-CM

## 2023-09-02 DIAGNOSIS — N50812 Left testicular pain: Secondary | ICD-10-CM

## 2023-09-02 DIAGNOSIS — N50811 Right testicular pain: Secondary | ICD-10-CM | POA: Insufficient documentation

## 2023-09-02 DIAGNOSIS — N452 Orchitis: Secondary | ICD-10-CM | POA: Diagnosis not present

## 2023-09-02 DIAGNOSIS — N433 Hydrocele, unspecified: Secondary | ICD-10-CM | POA: Diagnosis not present

## 2023-09-02 LAB — URINALYSIS, ROUTINE W REFLEX MICROSCOPIC
Bilirubin, UA: NEGATIVE
Glucose, UA: NEGATIVE
Leukocytes,UA: NEGATIVE
Nitrite, UA: NEGATIVE
Protein,UA: NEGATIVE
RBC, UA: NEGATIVE
Specific Gravity, UA: 1.025 (ref 1.005–1.030)
Urobilinogen, Ur: 0.2 mg/dL (ref 0.2–1.0)
pH, UA: 5.5 (ref 5.0–7.5)

## 2023-09-02 LAB — MICROSCOPIC EXAMINATION
RBC, Urine: NONE SEEN /[HPF] (ref 0–2)
Renal Epithel, UA: NONE SEEN /[HPF]
Yeast, UA: NONE SEEN

## 2023-09-02 MED ORDER — DOXYCYCLINE HYCLATE 100 MG PO TABS
100.0000 mg | ORAL_TABLET | Freq: Two times a day (BID) | ORAL | 0 refills | Status: AC
Start: 2023-09-02 — End: 2023-09-09

## 2023-09-02 MED ORDER — KETOROLAC TROMETHAMINE 30 MG/ML IJ SOLN
30.0000 mg | Freq: Once | INTRAMUSCULAR | Status: AC
Start: 2023-09-02 — End: 2023-09-02
  Administered 2023-09-02: 30 mg via INTRAMUSCULAR

## 2023-09-02 MED ORDER — TAMSULOSIN HCL 0.4 MG PO CAPS
0.8000 mg | ORAL_CAPSULE | Freq: Every day | ORAL | 0 refills | Status: DC
Start: 2023-09-02 — End: 2023-10-06

## 2023-09-02 MED ORDER — CEFIXIME 400 MG PO CAPS
800.0000 mg | ORAL_CAPSULE | Freq: Once | ORAL | 0 refills | Status: AC
Start: 2023-09-02 — End: 2023-09-02

## 2023-09-02 NOTE — Addendum Note (Signed)
Addended by: Neale Burly on: 09/02/2023 04:33 PM   Modules accepted: Orders

## 2023-09-02 NOTE — Progress Notes (Signed)
Subjective:  Patient ID: Jacob Wood, male    DOB: [birth year], 57 y.o.   MRN: [id number]  Patient Care Team: Dettinger, Elige Radon, MD as PCP - General (Family Medicine)   Chief Complaint:  testicular/lower belly pain (Kidney stones?/Pain off and on over the last couple of months)  HPI: Jacob Wood is a 57 y.o. male presenting on 09/02/2023 for testicular/lower belly pain (Kidney stones?/Pain off and on over the last couple of months)  HPI 1. Kidney stone States that he had a kidney stone diagnosis in September. Pain is coming and going. States that he has a few good days and then worse days. The past few days have been "miserable"  Has not passed any blood or stone that he knows of. States pain is in his testicles and lower ab. Taking flomax daily. Endorses pressure in testicle Did not change with lifting.   Relevant past medical, surgical, family, and social history reviewed and updated as indicated.  Allergies and medications reviewed and updated. Data reviewed: Chart in Epic.   Past Medical History:  Diagnosis Date   Hearing loss    left   Hyperlipidemia    Hypertension     Past Surgical History:  Procedure Laterality Date   KNEE CARTILAGE SURGERY      Social History   Socioeconomic History   Marital status: Married    Spouse name: Not on file   Number of children: Not on file   Years of education: Not on file   Highest education level: Not on file  Occupational History   Not on file  Tobacco Use   Smoking status: Never   Smokeless tobacco: Never  Vaping Use   Vaping status: Never Used  Substance and Sexual Activity   Alcohol use: Yes    Alcohol/week: 84.0 standard drinks of alcohol    Types: 84 Cans of beer per week    Comment: 2-3 times a week.    Drug use: No   Sexual activity: Not on file  Other Topics Concern   Not on file  Social History Narrative   Not on file   Social Determinants of Health   Financial Resource Strain: Not on file   Food Insecurity: Not on file  Transportation Needs: Not on file  Physical Activity: Not on file  Stress: Not on file  Social Connections: Not on file  Intimate Partner Violence: Not on file    Outpatient Encounter Medications as of 09/02/2023  Medication Sig   losartan (COZAAR) 50 MG tablet Take 1 tablet by mouth once daily   Multiple Vitamin (MULTIVITAMIN) capsule Take 1 capsule by mouth daily.   Omega-3 Fatty Acids (FISH OIL) 1000 MG CAPS Take by mouth.   rosuvastatin (CRESTOR) 40 MG tablet Take 1 tablet (40 mg total) by mouth daily.   tamsulosin (FLOMAX) 0.4 MG CAPS capsule Take 1 capsule (0.4 mg total) by mouth daily.   Turmeric (QC TUMERIC COMPLEX PO) Take by mouth.   No facility-administered encounter medications on file as of 09/02/2023.    No Known Allergies  Review of Systems As per HPI  Objective:  BP 124/80   Pulse 86   Temp 98.9 F (37.2 C)   Ht 6\' 1"  (1.854 m)   Wt 235 lb (106.6 kg)   SpO2 94%   BMI 31.00 kg/m    Wt Readings from Last 3 Encounters:  09/02/23 235 lb (106.6 kg)  07/20/23 241 lb 12.8 oz (109.7 kg)  07/12/23 242 lb  9.6 oz (110 kg)   Physical Exam Chaperone present: declined chaperone.  Constitutional:      General: He is awake. He is not in acute distress.    Appearance: Normal appearance. He is well-developed and well-groomed. He is not ill-appearing, toxic-appearing or diaphoretic.  Cardiovascular:     Rate and Rhythm: Normal rate and regular rhythm.     Pulses: Normal pulses.          Radial pulses are 2+ on the right side and 2+ on the left side.       Posterior tibial pulses are 2+ on the right side and 2+ on the left side.     Heart sounds: Normal heart sounds. No murmur heard.    No gallop.  Pulmonary:     Effort: Pulmonary effort is normal. No respiratory distress.     Breath sounds: Normal breath sounds. No stridor. No wheezing, rhonchi or rales.  Genitourinary:    Comments: Pain upon palpation of right lower testicle   Musculoskeletal:     Cervical back: Full passive range of motion without pain and neck supple.     Right lower leg: No edema.     Left lower leg: No edema.  Skin:    General: Skin is warm.     Capillary Refill: Capillary refill takes less than 2 seconds.  Neurological:     General: No focal deficit present.     Mental Status: He is alert, oriented to person, place, and time and easily aroused. Mental status is at baseline.     GCS: GCS eye subscore is 4. GCS verbal subscore is 5. GCS motor subscore is 6.     Motor: No weakness.  Psychiatric:        Attention and Perception: Attention and perception normal.        Mood and Affect: Mood and affect normal.        Speech: Speech normal.        Behavior: Behavior normal. Behavior is cooperative.        Thought Content: Thought content normal. Thought content does not include homicidal or suicidal ideation. Thought content does not include homicidal or suicidal plan.        Cognition and Memory: Cognition and memory normal.        Judgment: Judgment normal.    Results for orders placed or performed in visit on 07/20/23  Lipid panel  Result Value Ref Range   Cholesterol, Total 162 100 - 199 mg/dL   Triglycerides 865 (H) 0 - 149 mg/dL   HDL 43 >78 mg/dL   VLDL Cholesterol Cal 31 5 - 40 mg/dL   LDL Chol Calc (NIH) 88 0 - 99 mg/dL   Chol/HDL Ratio 3.8 0.0 - 5.0 ratio       09/02/2023   10:32 AM 07/20/2023    2:19 PM 01/13/2023    1:20 PM 07/15/2022    1:17 PM 07/15/2022    1:16 PM  Depression screen PHQ 2/9  Decreased Interest 0 0 0  0  Down, Depressed, Hopeless 0 0 0  0  PHQ - 2 Score 0 0 0  0  Altered sleeping 0 0 0 0   Tired, decreased energy 0 0 0 0   Change in appetite 0 0 0 0   Feeling bad or failure about yourself  0 0 0 0   Trouble concentrating 0 0 0 0   Moving slowly or fidgety/restless 0 0 0 0   Suicidal  thoughts 0 0 0 0   PHQ-9 Score 0 0 0    Difficult doing work/chores  Not difficult at all Not difficult at all Not  difficult at all        09/02/2023   10:33 AM 07/20/2023    2:19 PM 01/13/2023    1:20 PM 07/15/2022    1:16 PM  GAD 7 : Generalized Anxiety Score  Nervous, Anxious, on Edge 0 0 0 0  Control/stop worrying 0 0 0 0  Worry too much - different things 0 0 0 0  Trouble relaxing 0 0 0 0  Restless 0 0 0 0  Easily annoyed or irritable 0 0 0 0  Afraid - awful might happen 0 0 0 0  Total GAD 7 Score 0 0 0 0  Anxiety Difficulty Not difficult at all Not difficult at all Not difficult at all Not difficult at all   Pertinent labs & imaging results that were available during my care of the patient were reviewed by me and considered in my medical decision making.  Assessment & Plan:  Jacob "Luan Pulling" was seen today for testicular/lower belly pain.  Diagnoses and all orders for this visit:  Pain in both testicles Provided injection as below for pain management. Will increase tamsulosin to 0.8 mg daily. Discussed with patient that given that there is no hematuria present on UA or micro, will send for ultrasound to rule out other causes of testicular pain. Will await results for next steps. Can consider referral to urology if symptoms continue  -     Cancel: US Scrotum; Future -     ketorolac (TORADOL) 30 MG/ML injection 30 mg -     US SCROTUM W/DOPPLER; Future  Kidney stone As above.  -     Urinalysis, Routine w reflex microscopic -     Urine Culture -     tamsulosin (FLOMAX) 0.4 MG CAPS capsule; Take 2 capsules (0.8 mg total) by mouth daily. -     Microscopic Examination  Continue all other maintenance medications.  Follow up plan: Return if symptoms worsen or fail to improve.  Continue healthy lifestyle choices, including diet (rich in fruits, vegetables, and lean proteins, and low in salt and simple carbohydrates) and exercise (at least 30 minutes of moderate physical activity daily).  Written and verbal instructions provided   The above assessment and management plan was discussed with  the patient. The patient verbalized understanding of and has agreed to the management plan. Patient is aware to call the clinic if they develop any new symptoms or if symptoms persist or worsen. Patient is aware when to return to the clinic for a follow-up visit. Patient educated on when it is appropriate to go to the emergency department.   Neale Burly, DNP-FNP [facility] [address] [place], [place] [address] [phone]

## 2023-09-02 NOTE — Progress Notes (Signed)
No evidence of torsion. Given possible orchitis will cover with antibiotics. Sent in one time dose of Cefixime 800 mg and Doxycycline 100 mg twice daily for 7 days. Will place referral to urology for follow up due to history of stone and continued pain.

## 2023-09-05 LAB — URINE CULTURE: Organism ID, Bacteria: NO GROWTH

## 2023-09-06 NOTE — Progress Notes (Signed)
Negative for UTI. Recommend patient follow up if symptoms continue. Referral to urology pending.

## 2023-09-07 ENCOUNTER — Ambulatory Visit: Payer: PRIVATE HEALTH INSURANCE | Admitting: Urology

## 2023-09-07 ENCOUNTER — Encounter: Payer: Self-pay | Admitting: Urology

## 2023-09-07 ENCOUNTER — Ambulatory Visit (HOSPITAL_COMMUNITY)
Admission: RE | Admit: 2023-09-07 | Discharge: 2023-09-07 | Disposition: A | Discharge status: Home / Self Care | Payer: BC Managed Care – PPO | Source: Ambulatory Visit | Attending: Urology | Admitting: Urology

## 2023-09-07 VITALS — BP 133/78 | HR 71

## 2023-09-07 DIAGNOSIS — K5730 Diverticulosis of large intestine without perforation or abscess without bleeding: Secondary | ICD-10-CM | POA: Diagnosis not present

## 2023-09-07 DIAGNOSIS — R109 Unspecified abdominal pain: Secondary | ICD-10-CM | POA: Diagnosis not present

## 2023-09-07 DIAGNOSIS — R1032 Left lower quadrant pain: Secondary | ICD-10-CM | POA: Diagnosis not present

## 2023-09-07 DIAGNOSIS — N453 Epididymo-orchitis: Secondary | ICD-10-CM

## 2023-09-07 DIAGNOSIS — N50812 Left testicular pain: Secondary | ICD-10-CM

## 2023-09-07 DIAGNOSIS — Z87442 Personal history of urinary calculi: Secondary | ICD-10-CM | POA: Diagnosis not present

## 2023-09-07 DIAGNOSIS — K4020 Bilateral inguinal hernia, without obstruction or gangrene, not specified as recurrent: Secondary | ICD-10-CM | POA: Diagnosis not present

## 2023-09-07 LAB — URINALYSIS, ROUTINE W REFLEX MICROSCOPIC
Bilirubin, UA: NEGATIVE
Glucose, UA: NEGATIVE
Ketones, UA: NEGATIVE
Leukocytes,UA: NEGATIVE
Nitrite, UA: NEGATIVE
Protein,UA: NEGATIVE
RBC, UA: NEGATIVE
Specific Gravity, UA: 1.02 (ref 1.005–1.030)
Urobilinogen, Ur: 0.2 mg/dL (ref 0.2–1.0)
pH, UA: 6 (ref 5.0–7.5)

## 2023-09-07 NOTE — Patient Instructions (Signed)
Flank Pain, Adult  Flank pain is pain that is located on the side of the body between the upper abdomen and the spine. This area is called the flank. The pain may occur over a short period of time (acute), or it may be long-term or recurring (chronic). It may be mild or severe. Flank pain can be caused by many things, including:  Muscle soreness or injury.  Kidney infection, kidney stones, or kidney disease.  Stress.  A disease of the spine (vertebral disk disease).  A lung infection (pneumonia).  Fluid around the lungs (pulmonary edema).  A skin rash caused by the chickenpox virus (shingles).  Tumors that affect the back of the abdomen.  Gallbladder disease.  Follow these instructions at home:    Drink enough fluid to keep your urine pale yellow.  Rest as told by your health care provider.  Take over-the-counter and prescription medicines only as told by your health care provider.  Keep a journal to track what has caused your flank pain and what has made it feel better.  Keep all follow-up visits. This is important.  Contact a health care provider if:  Your pain is not controlled with medicine.  You have new symptoms.  Your pain gets worse.  Your symptoms last longer than 2-3 days.  You have trouble urinating or you are urinating very frequently.  Get help right away if:  You have trouble breathing or you are short of breath.  Your abdomen hurts or it is swollen or red.  You have nausea or vomiting.  You feel faint, or you faint.  You have blood in your urine.  You have flank pain and a fever.  These symptoms may represent a serious problem that is an emergency. Do not wait to see if the symptoms will go away. Get medical help right away. Call your local emergency services (911 in the [place] Do not drive yourself to the hospital.  Summary  Flank pain is pain that is located on the side of the body between the upper abdomen and the spine.  The pain may occur over a short period of time (acute), or it may be  long-term or recurring (chronic). It may be mild or severe.  Flank pain can be caused by many things.  Contact your health care provider if your symptoms get worse or last longer than 2-3 days.  This information is not intended to replace advice given to you by your health care provider. Make sure you discuss any questions you have with your health care provider.  Document Revised: 12/29/2020 Document Reviewed: 12/29/2020  Elsevier Patient Education  2024 [employer].

## 2023-09-07 NOTE — Progress Notes (Signed)
09/07/2023 10:30 AM   Jacob Wood Jacob Wood [birth year] [id number]  Referring provider: Arrie Senate, FNP [address] [place],  [place] [address]  Testicular pain    HPI: Jacob Wood is a 57yo here for evaluation of testicular pain. Starting beginning of September he developed left flank pain and underwent KUB which showed possible 2mm left mid ureteral calculus. No prior hx of nephrolithiasis. He was started on flomax 0.4mg  daily. He then developed left flank pain radiating to his left testis and underwent scrotal US which showed possible left epididymitis. He was then started on doxycycline this week. He notes no improvement in his left testis and flank pain.    PMH: Past Medical History:  Diagnosis Date   Hearing loss    left   Hyperlipidemia    Hypertension     Surgical History: Past Surgical History:  Procedure Laterality Date   KNEE CARTILAGE SURGERY      Home Medications:  Allergies as of 09/07/2023   No Known Allergies      Medication List        Accurate as of September 07, 2023 10:30 AM. If you have any questions, ask your nurse or doctor.          doxycycline 100 MG tablet Commonly known as: VIBRA-TABS Take 1 tablet (100 mg total) by mouth 2 (two) times daily for 7 days.   Fish Oil 1000 MG Caps Take by mouth.   losartan 50 MG tablet Commonly known as: COZAAR Take 1 tablet by mouth once daily   multivitamin capsule Take 1 capsule by mouth daily.   QC TUMERIC COMPLEX PO Take by mouth.   rosuvastatin 40 MG tablet Commonly known as: Crestor Take 1 tablet (40 mg total) by mouth daily.   tamsulosin 0.4 MG Caps capsule Commonly known as: FLOMAX Take 2 capsules (0.8 mg total) by mouth daily.        Allergies: No Known Allergies  Family History: Family History  Problem Relation Age of Onset   Colon cancer Neg Hx    Colon polyps Neg Hx    Esophageal cancer Neg Hx    Rectal cancer Neg Hx    Stomach cancer Neg Hx     Social  History:  reports that he has never smoked. He has never used smokeless tobacco. He reports current alcohol use of about 84.0 standard drinks of alcohol per week. He reports that he does not use drugs.  ROS: All other review of systems were reviewed and are negative except what is noted above in HPI  Physical Exam: BP 133/78   Pulse 71   Constitutional:  Alert and oriented, No acute distress. HEENT: [place] AT, moist mucus membranes.  Trachea midline, no masses. Cardiovascular: No clubbing, cyanosis, or edema. Respiratory: Normal respiratory effort, no increased work of breathing. GI: Abdomen is soft, nontender, nondistended, no abdominal masses GU: No CVA tenderness. Circumcised phallus. No masses/lesions on penis, testis, scrotum.  Lymph: No cervical or inguinal lymphadenopathy. Skin: No rashes, bruises or suspicious lesions. Neurologic: Grossly intact, no focal deficits, moving all 4 extremities. Psychiatric: Normal mood and affect.  Laboratory Data: Lab Results  Component Value Date   WBC 11.5 (H) 07/12/2023   HGB 14.8 07/12/2023   HCT 45.8 07/12/2023   MCV 96 07/12/2023   PLT 208 07/12/2023    Lab Results  Component Value Date   CREATININE 1.13 07/12/2023    No results found for: "PSA"  No results found for: "TESTOSTERONE"  No  results found for: "HGBA1C"  Urinalysis    Component Value Date/Time   APPEARANCEUR Clear 09/02/2023 1034   GLUCOSEU Negative 09/02/2023 1034   BILIRUBINUR Negative 09/02/2023 1034   PROTEINUR Negative 09/02/2023 1034   NITRITE Negative 09/02/2023 1034   LEUKOCYTESUR Negative 09/02/2023 1034    Lab Results  Component Value Date   LABMICR See below: 09/02/2023   WBCUA 0-5 09/02/2023   LABEPIT 0-10 09/02/2023   BACTERIA Few (A) 09/02/2023    Pertinent Imaging: KUB 07/12/2023 and scrotal US 11/01/2022: Images reviewed and discussed with the patient  Results for orders placed in visit on 07/12/23  DG Abd 1 View  Narrative CLINICAL  DATA:  Left lower quadrant abdominal pain  EXAM: ABDOMEN - 1 VIEW  COMPARISON:  None Available.  FINDINGS: The bowel gas pattern is normal. No dilated loops of large or small bowel. No signs of free air. Tiny calcific density in the projection of the lower left sacrum measures approximately 2 mm.  IMPRESSION: 1. Nonobstructive bowel gas pattern. 2. Tiny calcific density in the projection of the lower left sacrum measures approximately 2 mm. This may represent a small ureteral calculus. Correlate for any clinical signs or symptoms of urinary tract calculi.   Electronically Signed By: Signa Kell M.D. On: 07/12/2023 13:50  No results found for this or any previous visit.  No results found for this or any previous visit.  No results found for this or any previous visit.  No results found for this or any previous visit.  No valid procedures specified. No results found for this or any previous visit.  No results found for this or any previous visit.   Assessment & Plan:    1. Orchitis and epididymitis -continue doxycycycline  - Urinalysis, Routine w reflex microscopic  2. History of kidney stones STAT CT stone study   No follow-ups on file.  Wilkie Aye, MD  [facility] [place]

## 2023-09-14 ENCOUNTER — Ambulatory Visit: Payer: BC Managed Care – PPO | Admitting: Urology

## 2023-09-14 VITALS — BP 117/70 | HR 66

## 2023-09-14 DIAGNOSIS — K4020 Bilateral inguinal hernia, without obstruction or gangrene, not specified as recurrent: Secondary | ICD-10-CM

## 2023-09-14 DIAGNOSIS — N50819 Testicular pain, unspecified: Secondary | ICD-10-CM

## 2023-09-14 DIAGNOSIS — Z87442 Personal history of urinary calculi: Secondary | ICD-10-CM

## 2023-09-14 MED ORDER — MELOXICAM 7.5 MG PO TABS: 7.5000 mg | ORAL_TABLET | Freq: Every day | ORAL | 2 refills | Status: DC

## 2023-09-14 NOTE — Progress Notes (Signed)
09/14/2023 11:57 AM   Jacob Wood [birth year] [id number]  Referring provider: Dettinger, Elige Radon, MD [address] [place],  [place] [address]  Followup testis pain  HPI: Mr Jacob Wood is a 57yo here for followup for nephrolithiasis and testicular paiun. His testicular pain is unimproved since last visit. The pain is worse with activity. CT stone study did not show a definitive stone but it did show bilateral inguinal hernias. NO other complaints today   PMH: Past Medical History:  Diagnosis Date   Hearing loss    left   Hyperlipidemia    Hypertension     Surgical History: Past Surgical History:  Procedure Laterality Date   KNEE CARTILAGE SURGERY      Home Medications:  Allergies as of 09/14/2023   No Known Allergies      Medication List        Accurate as of September 14, 2023 11:57 AM. If you have any questions, ask your nurse or doctor.          Fish Oil 1000 MG Caps Take by mouth.   losartan 50 MG tablet Commonly known as: COZAAR Take 1 tablet by mouth once daily   multivitamin capsule Take 1 capsule by mouth daily.   QC TUMERIC COMPLEX PO Take by mouth.   rosuvastatin 40 MG tablet Commonly known as: Crestor Take 1 tablet (40 mg total) by mouth daily.   tamsulosin 0.4 MG Caps capsule Commonly known as: FLOMAX Take 2 capsules (0.8 mg total) by mouth daily.        Allergies: No Known Allergies  Family History: Family History  Problem Relation Age of Onset   Colon cancer Neg Hx    Colon polyps Neg Hx    Esophageal cancer Neg Hx    Rectal cancer Neg Hx    Stomach cancer Neg Hx     Social History:  reports that he has never smoked. He has never used smokeless tobacco. He reports current alcohol use of about 84.0 standard drinks of alcohol per week. He reports that he does not use drugs.  ROS: All other review of systems were reviewed and are negative except what is noted above in HPI  Physical Exam: BP 117/70   Pulse 66    Constitutional:  Alert and oriented, No acute distress. HEENT: [place] AT, moist mucus membranes.  Trachea midline, no masses. Cardiovascular: No clubbing, cyanosis, or edema. Respiratory: Normal respiratory effort, no increased work of breathing. GI: Abdomen is soft, nontender, nondistended, no abdominal masses GU: No CVA tenderness.  Lymph: No cervical or inguinal lymphadenopathy. Skin: No rashes, bruises or suspicious lesions. Neurologic: Grossly intact, no focal deficits, moving all 4 extremities. Psychiatric: Normal mood and affect.  Laboratory Data: Lab Results  Component Value Date   WBC 11.5 (H) 07/12/2023   HGB 14.8 07/12/2023   HCT 45.8 07/12/2023   MCV 96 07/12/2023   PLT 208 07/12/2023    Lab Results  Component Value Date   CREATININE 1.13 07/12/2023    No results found for: "PSA"  No results found for: "TESTOSTERONE"  No results found for: "HGBA1C"  Urinalysis    Component Value Date/Time   APPEARANCEUR Clear 09/07/2023 1007   GLUCOSEU Negative 09/07/2023 1007   BILIRUBINUR Negative 09/07/2023 1007   PROTEINUR Negative 09/07/2023 1007   NITRITE Negative 09/07/2023 1007   LEUKOCYTESUR Negative 09/07/2023 1007    Lab Results  Component Value Date   LABMICR Comment 09/07/2023   WBCUA 0-5 09/02/2023   LABEPIT  0-10 09/02/2023   BACTERIA Few (A) 09/02/2023    Pertinent Imaging: Ct stone study 09/07/2023: Images reviewed and discussed with the patient Results for orders placed in visit on 07/12/23  DG Abd 1 View  Narrative CLINICAL DATA:  Left lower quadrant abdominal pain  EXAM: ABDOMEN - 1 VIEW  COMPARISON:  None Available.  FINDINGS: The bowel gas pattern is normal. No dilated loops of large or small bowel. No signs of free air. Tiny calcific density in the projection of the lower left sacrum measures approximately 2 mm.  IMPRESSION: 1. Nonobstructive bowel gas pattern. 2. Tiny calcific density in the projection of the lower left  sacrum measures approximately 2 mm. This may represent a small ureteral calculus. Correlate for any clinical signs or symptoms of urinary tract calculi.   Electronically Signed By: Signa Kell M.D. On: 07/12/2023 13:50  No results found for this or any previous visit.  No results found for this or any previous visit.  No results found for this or any previous visit.  No results found for this or any previous visit.  No valid procedures specified. No results found for this or any previous visit.  Results for orders placed in visit on 09/07/23  CT RENAL STONE STUDY  Narrative CLINICAL DATA:  Abdominal/flank pain.  Evaluate for kidney stone.  EXAM: CT ABDOMEN AND PELVIS WITHOUT CONTRAST  TECHNIQUE: Multidetector CT imaging of the abdomen and pelvis was performed following the standard protocol without IV contrast.  RADIATION DOSE REDUCTION: This exam was performed according to the departmental dose-optimization program which includes automated exposure control, adjustment of the mA and/or kV according to patient size and/or use of iterative reconstruction technique.  COMPARISON:  None Available.  FINDINGS: Lower chest: No acute abnormality.  Hepatobiliary: No focal liver abnormality is seen. No gallstones, gallbladder wall thickening, or biliary dilatation.  Pancreas: Unremarkable. No pancreatic ductal dilatation or surrounding inflammatory changes.  Spleen: Normal in size without focal abnormality.  Adrenals/Urinary Tract: Normal adrenal glands. No nephrolithiasis, hydronephrosis or mass. No hydroureter or ureteral calculi. Partially decompressed urinary bladder is unremarkable without focal abnormality  Stomach/Bowel: Stomach is normal. The appendix is visualized and appears normal. Scattered colonic diverticula noted. No bowel wall thickening, inflammation, or distension.  Vascular/Lymphatic: No significant vascular findings are present. No enlarged  abdominal or pelvic lymph nodes.  Reproductive: Prostate is unremarkable.  Other: No free fluid or fluid collections. No signs of pneumoperitoneum. Bilateral fat containing inguinal hernias identified.  Musculoskeletal: Lumbar degenerative disc disease is identified at L4-5 and L5-S1. Bilateral L5 pars defects noted.  IMPRESSION: 1. No acute findings within the abdomen or pelvis. 2. No evidence for nephrolithiasis or hydronephrosis. 3. Bilateral fat containing inguinal hernias. 4. Lumbar degenerative disc disease at L4-5 and L5-S1. Bilateral L5 pars defects.   Electronically Signed By: Signa Kell M.D. On: 09/07/2023 15:42   Assessment & Plan:    1. Bilateral inguinal hernias -referral to general surgery -meloxicam 7.5mg  daily - Urinalysis, Routine w reflex microscopic   No follow-ups on file.  Wilkie Aye, MD  [facility] [place]

## 2023-09-20 ENCOUNTER — Encounter: Payer: Self-pay | Admitting: Urology

## 2023-09-20 NOTE — Patient Instructions (Signed)
Hernia, Adult     A hernia happens when an organ or tissue inside your body pushes out through a weak spot in the muscles of your belly. This makes a bulge. The bulge may be: In a scar from surgery. This type of bulge is called an incisional hernia. Near your belly button. This type is called an umbilical hernia. In your groin, which is the area where your leg meets your lower belly. This type is called an inguinal hernia. If you're male, this type could also be in your scrotum. In your upper thigh. This type is called a femoral hernia. Inside your belly. This type is called a hiatal hernia. It happens when your stomach slides above your diaphragm, which is the muscle between your belly and your chest. What are the causes? You may get a hernia if: You lift heavy things. You cough over a long period of time. You have trouble pooping, also called constipation. Trouble pooping can lead to straining. There's a cut from surgery in your belly. You have a problem that's present at birth. There's fluid around your belly. You're male and have a testicle that hasn't moved down into your scrotum. You may be at greater risk for hernia if: You smoke. You're very overweight. What are the signs or symptoms? The main symptom is a bulge, but the bulge may not always be seen. It may grow bigger or be easier to see when you cough or strain, such as when you lift something heavy. If you can push the bulge back into your belly, it may not cause pain. If you can't push it back into your belly, it may lose its blood supply. This can cause: Pain. Fever. Nausea and vomiting. Swelling. Trouble pooping. How is this diagnosed? A hernia may be diagnosed based on your symptoms, medical history, and an exam. Your health care provider may ask you to cough or move in a way that makes the bulge easier to see. You may also have tests done. These may include: X-rays. Ultrasound. CT scan. How is this treated? A  hernia that's small and painless may not need to be treated. A hernia that's large or painful may be treated with surgery. Surgery involves pushing the bulge back into place and fixing the weak area of the muscle or belly. Follow these instructions at home: Activity Try not to strain. You may have to avoid lifting. Ask how much weight you can safely lift. If you lift something heavy, use your leg muscles. Do not use your back muscles to lift. Prevent trouble pooping You may need to take these actions to prevent or treat trouble pooping: Drink enough fluid to keep your pee (urine) pale yellow. Take medicines to help you poop. Eat foods high in fiber. These include beans, whole grains, and fresh fruits and vegetables. General instructions When you cough, try to cough gently. Try to push the bulge back in by very gently pressing on it when you're lying down. Do not try to force it back in if it won't push in easily. If you're overweight, work with your provider to lose weight safely. Do not smoke, vape, or use products with nicotine or tobacco in them. If you need help quitting, talk with your provider. If you're going to have surgery, watch your hernia for changes in shape, size, or color. Tell your provider about any changes. Contact a health care provider if: You get new pain, swelling, or redness near your hernia. You have trouble pooping.  Your poop is harder or larger than normal. You have a fever or chills. You have nausea or vomiting. Your hernia can't be pushed in. Get help right away if: You have belly pain that gets worse. Your hernia: Changes in shape or size. Changes color. Feels hard or hurts when you touch it. These symptoms may be an emergency. Call 911 right away. Do not wait to see if the symptoms will go away. Do not drive yourself to the hospital. This information is not intended to replace advice given to you by your health care provider. Make sure you discuss any  questions you have with your health care provider. Document Revised: 01/11/2023 Document Reviewed: 01/11/2023 Elsevier Patient Education  2024 [employer].

## 2023-10-06 ENCOUNTER — Ambulatory Visit (INDEPENDENT_AMBULATORY_CARE_PROVIDER_SITE_OTHER): Payer: BC Managed Care – PPO | Admitting: General Surgery

## 2023-10-06 ENCOUNTER — Encounter: Payer: Self-pay | Admitting: General Surgery

## 2023-10-06 VITALS — BP 145/89 | HR 69 | Temp 98.2°F | Resp 12 | Ht 73.0 in | Wt 240.0 lb

## 2023-10-06 DIAGNOSIS — K4020 Bilateral inguinal hernia, without obstruction or gangrene, not specified as recurrent: Secondary | ICD-10-CM | POA: Diagnosis not present

## 2023-10-06 NOTE — Progress Notes (Signed)
Jacob Wood; [id number]; 1966-01-20   HPI Patient is a 57 year old white male who was referred to my care by Dr. Ronne Binning of urology for evaluation and treatment of bilateral inguinal hernias.  This was found on CT scan of the abdomen and pelvis for workup of testicular pain and history of kidney stones.  No stones were seen on CT scan but the patient was noted to have bilateral inguinal hernias.  Patient reports he has had this for several months but the pain is worsening.  It is made worse with straining.  The pain radiates to the testicles on both sides.  No nausea or vomiting have been noted. Past Medical History:  Diagnosis Date   Hearing loss    left   Hyperlipidemia    Hypertension     Past Surgical History:  Procedure Laterality Date   KNEE CARTILAGE SURGERY      Family History  Problem Relation Age of Onset   Colon cancer Neg Hx    Colon polyps Neg Hx    Esophageal cancer Neg Hx    Rectal cancer Neg Hx    Stomach cancer Neg Hx     Current Outpatient Medications on File Prior to Visit  Medication Sig Dispense Refill   losartan (COZAAR) 50 MG tablet Take 1 tablet by mouth once daily 90 tablet 1   meloxicam (MOBIC) 7.5 MG tablet Take 1 tablet (7.5 mg total) by mouth daily. 30 tablet 2   Multiple Vitamin (MULTIVITAMIN) capsule Take 1 capsule by mouth daily.     Omega-3 Fatty Acids (FISH OIL) 1000 MG CAPS Take by mouth.     rosuvastatin (CRESTOR) 40 MG tablet Take 1 tablet (40 mg total) by mouth daily. 90 tablet 3   Turmeric (QC TUMERIC COMPLEX PO) Take by mouth.     No current facility-administered medications on file prior to visit.    No Known Allergies  Social History   Substance and Sexual Activity  Alcohol Use Yes   Alcohol/week: 84.0 standard drinks of alcohol   Types: 84 Cans of beer per week   Comment: 2-3 times a week.     Social History   Tobacco Use  Smoking Status Never  Smokeless Tobacco Never    Review of Systems  Constitutional:  Negative.   HENT: Negative.    Eyes: Negative.   Respiratory: Negative.    Cardiovascular: Negative.   Gastrointestinal:  Positive for abdominal pain.  Genitourinary: Negative.   Musculoskeletal:  Positive for back pain and joint pain.  Skin: Negative.   Neurological: Negative.   Endo/Heme/Allergies: Negative.   Psychiatric/Behavioral: Negative.      Objective   Vitals:   10/06/23 0954  BP: (!) 145/89  Pulse: 69  Resp: 12  Temp: 98.2 F (36.8 C)  SpO2: 95%    Physical Exam Vitals reviewed.  Constitutional:      Appearance: Normal appearance. He is not ill-appearing.  HENT:     Head: Normocephalic and atraumatic.  Cardiovascular:     Rate and Rhythm: Normal rate and regular rhythm.     Heart sounds: Normal heart sounds. No murmur heard.    No friction rub. No gallop.  Pulmonary:     Effort: Pulmonary effort is normal. No respiratory distress.     Breath sounds: Normal breath sounds. No stridor. No wheezing, rhonchi or rales.  Abdominal:     General: Bowel sounds are normal. There is no distension.     Palpations: Abdomen is soft. There is no mass.  Tenderness: There is no abdominal tenderness. There is no guarding or rebound.     Hernia: A hernia is present.     Comments: Bilateral reducible inguinal hernias noted.  Genitourinary:    Testes: Normal.  Skin:    General: Skin is warm and dry.  Neurological:     Mental Status: He is alert and oriented to person, place, and time.   CT scan results reviewed Urology notes reviewed  Assessment  Bilateral inguinal hernias Plan  Patient is scheduled for robotic assisted laparoscopic bilateral inguinal herniorrhaphies with mesh on 10/11/2023.  The risks and benefits of the procedure including bleeding, infection, mesh use, and the possibility of recurrence of the hernias were fully explained to the patient, who gave informed consent.

## 2023-10-06 NOTE — Addendum Note (Signed)
Addended by: Phillips Odor on: 10/06/2023 04:46 PM   Modules accepted: Orders

## 2023-10-06 NOTE — H&P (Signed)
Jacob Wood; [id number]; 1966-01-20   HPI Patient is a 57 year old white male who was referred to my care by Dr. Ronne Binning of urology for evaluation and treatment of bilateral inguinal hernias.  This was found on CT scan of the abdomen and pelvis for workup of testicular pain and history of kidney stones.  No stones were seen on CT scan but the patient was noted to have bilateral inguinal hernias.  Patient reports he has had this for several months but the pain is worsening.  It is made worse with straining.  The pain radiates to the testicles on both sides.  No nausea or vomiting have been noted. Past Medical History:  Diagnosis Date   Hearing loss    left   Hyperlipidemia    Hypertension     Past Surgical History:  Procedure Laterality Date   KNEE CARTILAGE SURGERY      Family History  Problem Relation Age of Onset   Colon cancer Neg Hx    Colon polyps Neg Hx    Esophageal cancer Neg Hx    Rectal cancer Neg Hx    Stomach cancer Neg Hx     Current Outpatient Medications on File Prior to Visit  Medication Sig Dispense Refill   losartan (COZAAR) 50 MG tablet Take 1 tablet by mouth once daily 90 tablet 1   meloxicam (MOBIC) 7.5 MG tablet Take 1 tablet (7.5 mg total) by mouth daily. 30 tablet 2   Multiple Vitamin (MULTIVITAMIN) capsule Take 1 capsule by mouth daily.     Omega-3 Fatty Acids (FISH OIL) 1000 MG CAPS Take by mouth.     rosuvastatin (CRESTOR) 40 MG tablet Take 1 tablet (40 mg total) by mouth daily. 90 tablet 3   Turmeric (QC TUMERIC COMPLEX PO) Take by mouth.     No current facility-administered medications on file prior to visit.    No Known Allergies  Social History   Substance and Sexual Activity  Alcohol Use Yes   Alcohol/week: 84.0 standard drinks of alcohol   Types: 84 Cans of beer per week   Comment: 2-3 times a week.     Social History   Tobacco Use  Smoking Status Never  Smokeless Tobacco Never    Review of Systems  Constitutional:  Negative.   HENT: Negative.    Eyes: Negative.   Respiratory: Negative.    Cardiovascular: Negative.   Gastrointestinal:  Positive for abdominal pain.  Genitourinary: Negative.   Musculoskeletal:  Positive for back pain and joint pain.  Skin: Negative.   Neurological: Negative.   Endo/Heme/Allergies: Negative.   Psychiatric/Behavioral: Negative.      Objective   Vitals:   10/06/23 0954  BP: (!) 145/89  Pulse: 69  Resp: 12  Temp: 98.2 F (36.8 C)  SpO2: 95%    Physical Exam Vitals reviewed.  Constitutional:      Appearance: Normal appearance. He is not ill-appearing.  HENT:     Head: Normocephalic and atraumatic.  Cardiovascular:     Rate and Rhythm: Normal rate and regular rhythm.     Heart sounds: Normal heart sounds. No murmur heard.    No friction rub. No gallop.  Pulmonary:     Effort: Pulmonary effort is normal. No respiratory distress.     Breath sounds: Normal breath sounds. No stridor. No wheezing, rhonchi or rales.  Abdominal:     General: Bowel sounds are normal. There is no distension.     Palpations: Abdomen is soft. There is no mass.  Tenderness: There is no abdominal tenderness. There is no guarding or rebound.     Hernia: A hernia is present.     Comments: Bilateral reducible inguinal hernias noted.  Genitourinary:    Testes: Normal.  Skin:    General: Skin is warm and dry.  Neurological:     Mental Status: He is alert and oriented to person, place, and time.   CT scan results reviewed Urology notes reviewed  Assessment  Bilateral inguinal hernias Plan  Patient is scheduled for robotic assisted laparoscopic bilateral inguinal herniorrhaphies with mesh on 10/11/2023.  The risks and benefits of the procedure including bleeding, infection, mesh use, and the possibility of recurrence of the hernias were fully explained to the patient, who gave informed consent.

## 2023-10-07 NOTE — Patient Instructions (Addendum)
Jacob Wood  10/07/2023     @PREFPERIOPPHARMACY @   Your procedure is scheduled on 10/11/2023.   Report to [facility] at 8:00 A.M.   Call this number if you have problems the morning of surgery:  [phone]  If you experience any cold or flu symptoms such as cough, fever, chills, shortness of breath, etc. between now and your scheduled surgery, please notify us at the above number.   Remember:   Do not eat  after midnight.   You may drink clear liquids until 6:00 the morning of the procedure .  Clear liquids allowed are:                    Water, Juice (No red color; non-citric and without pulp; diabetics please choose diet or no sugar options), Carbonated beverages (diabetics please choose diet or no sugar options), Clear Tea (No creamer, milk, or cream, including half & half and powdered creamer), and Black Coffee Only (No creamer, milk or cream, including half & half and powdered creamer)    Take these medicines the morning of surgery with A SIP OF WATER :  meloxicam    Do not wear jewelry, make-up or nail polish, including gel polish,  artificial nails, or any other type of covering on natural nails (fingers and  toes).  Do not wear lotions, powders, or perfumes, or deodorant.  Do not shave 48 hours prior to surgery.  Men may shave face and neck.  Do not bring valuables to the hospital.  [facility] is not responsible for any belongings or valuables.  Contacts, dentures or bridgework may not be worn into surgery.  Leave your suitcase in the car.  After surgery it may be brought to your room.  For patients admitted to the hospital, discharge time will be determined by your treatment team.  Patients discharged the day of surgery will not be allowed to drive home.   Name and phone number of your driver:   Family Special instructions:  N/A  Please read over the following fact sheets that you were given. Care and Recovery After Surgery  Laparoscopic Inguinal Hernia  Repair, Adult Laparoscopic inguinal hernia repair is a surgical procedure to repair a small, weak spot in the groin muscles that allows fat or intestines from inside the abdomen to bulge out (inguinal hernia). This procedure may be planned, or it may be an emergency procedure. During the procedure, tissue that has bulged out is moved back into place, and the opening in the groin muscles is repaired. This is done through three small incisions in the abdomen. A thin tube with a light and camera on the end (laparoscope) is used to help perform the procedure. Tell a health care provider about: Any allergies you have. All medicines you are taking, including vitamins, herbs, eye drops, creams, and over-the-counter medicines. Any problems you or family members have had with anesthetic medicines. Any blood disorders you have. Any surgeries you have had. Any medical conditions you have. Whether you are pregnant or may be pregnant. What are the risks? Generally, this is a safe procedure. However, problems may occur, including: Infection. Bleeding. Allergic reactions to medicines. Damage to nearby structures or organs. Testicle damage or long-term pain and swelling of the scrotum, in males. Inability to completely empty the bladder (urinary retention). Blood clots. A collection of fluid that builds up under the skin (seroma). The hernia coming back (recurrence). What happens before the procedure? Staying hydrated Follow instructions from  your health care provider about hydration, which may include: Up to 2 hours before the procedure - you may continue to drink clear liquids, such as water, clear fruit juice, black coffee, and plain tea.  Eating and drinking restrictions Follow instructions from your health care provider about eating and drinking, which may include: 8 hours before the procedure - stop eating heavy meals or foods, such as meat, fried foods, or fatty foods. 6 hours before the  procedure - stop eating light meals or foods, such as toast or cereal. 6 hours before the procedure - stop drinking milk or drinks that contain milk. 2 hours before the procedure - stop drinking clear liquids. Medicines Ask your health care provider about: Changing or stopping your regular medicines. This is especially important if you are taking diabetes medicines or blood thinners. Taking medicines such as aspirin and ibuprofen. These medicines can thin your blood. Do not take these medicines unless your health care provider tells you to take them. Taking over-the-counter medicines, vitamins, herbs, and supplements. General instructions Do not use any products that contain nicotine or tobacco for at least 4 weeks before the procedure, if possible. These products include cigarettes, chewing tobacco, and vaping devices, such as e-cigarettes. If you need help quitting, ask your health care provider. Ask your health care provider: How your surgery site will be marked. What steps will be taken to help prevent infection. These steps may include: Removing hair at the surgery site. Washing skin with a germ-killing soap. Taking antibiotic medicine. Plan to have a responsible adult take you home from the hospital or clinic. Plan to have a responsible adult care for you for the time you are told after you leave the hospital or clinic. This is important. What happens during the procedure? An IV will be inserted into one of your veins. You will be given one or more of the following: A medicine to help you relax (sedative). A medicine to make you fall asleep (general anesthetic). Three small incisions will be made in your abdomen. Your abdomen will be inflated with carbon dioxide gas to make the surgical area easier to see. A laparoscope and surgical instruments will be inserted through the incisions. The laparoscope will send images of the inside of your abdomen to a monitor in the room. Tissue that  is bulging through the hernia may be removed or moved back into place. The hernia opening will be closed with a sheet of surgical mesh. The surgical instruments and laparoscope will be removed. Your incisions will be closed with stitches (sutures) and adhesive strips. A bandage (dressing) will be placed over your incisions. The procedure may vary among health care providers and hospitals. What happens after the procedure? Your blood pressure, heart rate, breathing rate, and blood oxygen level will be monitored until you leave the hospital or clinic. You will be given pain medicine as needed. You may continue to receive medicines and fluids through an IV. The IV will be removed after you can drink fluids. You will be encouraged to get up and move around and to take deep breaths frequently. If you were given a sedative during the procedure, it can affect you for several hours. Do not drive or operate machinery until your health care provider says that it is safe. Summary Laparoscopic inguinal hernia repair is a surgical procedure to repair a small, weak spot in the groin muscles that allows fat or intestines from inside the abdomen to bulge out (inguinal hernia). This procedure is  done through three small incisions in the abdomen. A thin tube with a light and camera on the end (laparoscope) is used to help perform the procedure. After the procedure, you will be encouraged to get up and move around and to take deep breaths frequently. This information is not intended to replace advice given to you by your health care provider. Make sure you discuss any questions you have with your health care provider. Document Revised: 06/17/2020 Document Reviewed: 06/17/2020 Elsevier Patient Education  2024 [employer]  General Anesthesia, Adult General anesthesia is the use of medicine to make you fall asleep (unconscious) for a medical procedure. General anesthesia must be used for certain procedures. It is  often recommended for surgery or procedures that: Last a long time. Require you to be still or in an unusual position. Are major and can cause blood loss. Affect your breathing. The medicines used for general anesthesia are called general anesthetics. During general anesthesia, these medicines are given along with medicines that: Prevent pain. Control your blood pressure. Relax your muscles. Prevent nausea and vomiting after the procedure. Tell a health care provider about: Any allergies you have. All medicines you are taking, including vitamins, herbs, eye drops, creams, and over-the-counter medicines. Your history of any: Medical conditions you have, including: High blood pressure. Bleeding problems. Diabetes. Heart or lung conditions, such as: Heart failure. Sleep apnea. Asthma. Chronic obstructive pulmonary disease (COPD). Current or recent illnesses, such as: Upper respiratory, chest, or ear infections. Cough or fever. Tobacco or drug use, including marijuana or alcohol use. Depression or anxiety. Surgeries and types of anesthetics you have had. Problems you or family members have had with anesthetic medicines. Whether you are pregnant or may be pregnant. Whether you have any chipped or loose teeth, dentures, caps, bridgework, or issues with your mouth, swallowing, or choking. What are the risks? Your health care provider will talk with you about risks. These may include: Allergic reaction to the medicines. Lung and heart problems. Inhaling food or liquid from the stomach into the lungs (aspiration). Nerve injury. Injury to the lips, mouth, teeth, or gums. Stroke. Waking up during your procedure and being unable to move. This is rare. These problems are more likely to develop if you are having a major surgery or if you have an advanced or serious medical condition. You can prevent some of these complications by answering all of your health care provider's questions  thoroughly and by following all instructions before your procedure. General anesthesia can cause side effects, including: Nausea or vomiting. A sore throat or hoarseness from the breathing tube. Wheezing or coughing. Shaking chills or feeling cold. Body aches. Sleepiness. Confusion, agitation (delirium), or anxiety. What happens before the procedure? When to stop eating and drinking Follow instructions from your health care provider about what you may eat and drink before your procedure. If you do not follow your health care provider's instructions, your procedure may be delayed or canceled. Medicines Ask your health care provider about: Changing or stopping your regular medicines. These include any diabetes medicines or blood thinners you take. Taking medicines such as aspirin and ibuprofen. These medicines can thin your blood. Do not take them unless your health care provider tells you to. Taking over-the-counter medicines, vitamins, herbs, and supplements. General instructions Do not use any products that contain nicotine or tobacco for at least 4 weeks before the procedure. These products include cigarettes, chewing tobacco, and vaping devices, such as e-cigarettes. If you need help quitting, ask your  health care provider. If you brush your teeth on the morning of the procedure, make sure to spit out all of the water and toothpaste. If told by your health care provider, bring your sleep apnea device with you to surgery (if applicable). If you will be going home right after the procedure, plan to have a responsible adult: Take you home from the hospital or clinic. You will not be allowed to drive. Care for you for the time you are told. What happens during the procedure?  An IV will be inserted into one of your veins. You will be given one or more of the following through a face mask or IV: A sedative. This helps you relax. Anesthesia. This will: Numb certain areas of your  body. Make you fall asleep for surgery. After you are unconscious, a breathing tube may be inserted down your throat to help you breathe. This will be removed before you wake up. An anesthesia provider, such as an anesthesiologist, will stay with you throughout your procedure. The anesthesia provider will: Keep you comfortable and safe by continuing to give you medicines and adjusting the amount of medicine that you get. Monitor your blood pressure, heart rate, and oxygen levels to make sure that the anesthetics do not cause any problems. The procedure may vary among health care providers and hospitals. What happens after the procedure? Your blood pressure, temperature, heart rate, breathing rate, and blood oxygen level will be monitored until you leave the hospital or clinic. You will wake up in a recovery area. You may wake up slowly. You may be given medicine to help you with pain, nausea, or any other side effects from the anesthesia. Summary General anesthesia is the use of medicine to make you fall asleep (unconscious) for a medical procedure. Follow your health care provider's instructions about when to stop eating, drinking, or taking certain medicines before your procedure. Plan to have a responsible adult take you home from the hospital or clinic. This information is not intended to replace advice given to you by your health care provider. Make sure you discuss any questions you have with your health care provider. Document Revised: 01/14/2022 Document Reviewed: 01/14/2022 Elsevier Patient Education  2024 [employer]  How to Use Chlorhexidine Before Surgery Chlorhexidine gluconate (CHG) is a germ-killing (antiseptic) solution that is used to clean the skin. It can get rid of the bacteria that normally live on the skin and can keep them away for about 24 hours. To clean your skin with CHG, you may be given: A CHG solution to use in the shower or as part of a sponge bath. A  prepackaged cloth that contains CHG. Cleaning your skin with CHG may help lower the risk for infection: While you are staying in the intensive care unit of the hospital. If you have a vascular access, such as a central line, to provide short-term or long-term access to your veins. If you have a catheter to drain urine from your bladder. If you are on a ventilator. A ventilator is a machine that helps you breathe by moving air in and out of your lungs. After surgery. What are the risks? Risks of using CHG include: A skin reaction. Hearing loss, if CHG gets in your ears and you have a perforated eardrum. Eye injury, if CHG gets in your eyes and is not rinsed out. The CHG product catching fire. Make sure that you avoid smoking and flames after applying CHG to your skin. Do not use  CHG: If you have a chlorhexidine allergy or have previously reacted to chlorhexidine. On babies younger than 18 months of age. How to use CHG solution Use CHG only as told by your health care provider, and follow the instructions on the label. Use the full amount of CHG as directed. Usually, this is one bottle. During a shower Follow these steps when using CHG solution during a shower (unless your health care provider gives you different instructions): Start the shower. Use your normal soap and shampoo to wash your face and hair. Turn off the shower or move out of the shower stream. Pour the CHG onto a clean washcloth. Do not use any type of brush or rough-edged sponge. Starting at your neck, lather your body down to your toes. Make sure you follow these instructions: If you will be having surgery, pay special attention to the part of your body where you will be having surgery. Scrub this area for at least 1 minute. Do not use CHG on your head or face. If the solution gets into your ears or eyes, rinse them well with water. Avoid your genital area. Avoid any areas of skin that have broken skin, cuts, or  scrapes. Scrub your back and under your arms. Make sure to wash skin folds. Let the lather sit on your skin for 1-2 minutes or as long as told by your health care provider. Thoroughly rinse your entire body in the shower. Make sure that all body creases and crevices are rinsed well. Dry off with a clean towel. Do not put any substances on your body afterward--such as powder, lotion, or perfume--unless you are told to do so by your health care provider. Only use lotions that are recommended by the manufacturer. Put on clean clothes or pajamas. If it is the night before your surgery, sleep in clean sheets.  During a sponge bath Follow these steps when using CHG solution during a sponge bath (unless your health care provider gives you different instructions): Use your normal soap and shampoo to wash your face and hair. Pour the CHG onto a clean washcloth. Starting at your neck, lather your body down to your toes. Make sure you follow these instructions: If you will be having surgery, pay special attention to the part of your body where you will be having surgery. Scrub this area for at least 1 minute. Do not use CHG on your head or face. If the solution gets into your ears or eyes, rinse them well with water. Avoid your genital area. Avoid any areas of skin that have broken skin, cuts, or scrapes. Scrub your back and under your arms. Make sure to wash skin folds. Let the lather sit on your skin for 1-2 minutes or as long as told by your health care provider. Using a different clean, wet washcloth, thoroughly rinse your entire body. Make sure that all body creases and crevices are rinsed well. Dry off with a clean towel. Do not put any substances on your body afterward--such as powder, lotion, or perfume--unless you are told to do so by your health care provider. Only use lotions that are recommended by the manufacturer. Put on clean clothes or pajamas. If it is the night before your surgery, sleep  in clean sheets. How to use CHG prepackaged cloths Only use CHG cloths as told by your health care provider, and follow the instructions on the label. Use the CHG cloth on clean, dry skin. Do not use the CHG cloth on your  head or face unless your health care provider tells you to. When washing with the CHG cloth: Avoid your genital area. Avoid any areas of skin that have broken skin, cuts, or scrapes. Before surgery Follow these steps when using a CHG cloth to clean before surgery (unless your health care provider gives you different instructions): Using the CHG cloth, vigorously scrub the part of your body where you will be having surgery. Scrub using a back-and-forth motion for 3 minutes. The area on your body should be completely wet with CHG when you are done scrubbing. Do not rinse. Discard the cloth and let the area air-dry. Do not put any substances on the area afterward, such as powder, lotion, or perfume. Put on clean clothes or pajamas. If it is the night before your surgery, sleep in clean sheets.  For general bathing Follow these steps when using CHG cloths for general bathing (unless your health care provider gives you different instructions). Use a separate CHG cloth for each area of your body. Make sure you wash between any folds of skin and between your fingers and toes. Wash your body in the following order, switching to a new cloth after each step: The front of your neck, shoulders, and chest. Both of your arms, under your arms, and your hands. Your stomach and groin area, avoiding the genitals. Your right leg and foot. Your left leg and foot. The back of your neck, your back, and your buttocks. Do not rinse. Discard the cloth and let the area air-dry. Do not put any substances on your body afterward--such as powder, lotion, or perfume--unless you are told to do so by your health care provider. Only use lotions that are recommended by the manufacturer. Put on clean clothes  or pajamas. Contact a health care provider if: Your skin gets irritated after scrubbing. You have questions about using your solution or cloth. You swallow any chlorhexidine. Call your local poison control center (707-576-9044 in the [place] Get help right away if: Your eyes itch badly, or they become very red or swollen. Your skin itches badly and is red or swollen. Your hearing changes. You have trouble seeing. You have swelling or tingling in your mouth or throat. You have trouble breathing. These symptoms may represent a serious problem that is an emergency. Do not wait to see if the symptoms will go away. Get medical help right away. Call your local emergency services (911 in the [place] Do not drive yourself to the hospital. Summary Chlorhexidine gluconate (CHG) is a germ-killing (antiseptic) solution that is used to clean the skin. Cleaning your skin with CHG may help to lower your risk for infection. You may be given CHG to use for bathing. It may be in a bottle or in a prepackaged cloth to use on your skin. Carefully follow your health care provider's instructions and the instructions on the product label. Do not use CHG if you have a chlorhexidine allergy. Contact your health care provider if your skin gets irritated after scrubbing. This information is not intended to replace advice given to you by your health care provider. Make sure you discuss any questions you have with your health care provider. Document Revised: 02/15/2022 Document Reviewed: 12/29/2020 Elsevier Patient Education  2023 [employer].

## 2023-10-10 ENCOUNTER — Encounter (HOSPITAL_COMMUNITY): Payer: Self-pay

## 2023-10-10 ENCOUNTER — Encounter (HOSPITAL_COMMUNITY)
Admission: RE | Admit: 2023-10-10 | Discharge: 2023-10-10 | Disposition: A | Discharge status: Home / Self Care | Payer: BC Managed Care – PPO | Source: Ambulatory Visit | Attending: General Surgery | Admitting: General Surgery

## 2023-10-10 ENCOUNTER — Other Ambulatory Visit: Payer: Self-pay

## 2023-10-10 VITALS — BP 145/85 | HR 69 | Temp 98.2°F | Resp 18 | Ht 73.0 in | Wt 240.0 lb

## 2023-10-10 DIAGNOSIS — Z79899 Other long term (current) drug therapy: Secondary | ICD-10-CM | POA: Diagnosis not present

## 2023-10-10 DIAGNOSIS — D176 Benign lipomatous neoplasm of spermatic cord: Secondary | ICD-10-CM | POA: Diagnosis not present

## 2023-10-10 DIAGNOSIS — M549 Dorsalgia, unspecified: Secondary | ICD-10-CM | POA: Diagnosis not present

## 2023-10-10 DIAGNOSIS — Z01818 Encounter for other preprocedural examination: Secondary | ICD-10-CM | POA: Insufficient documentation

## 2023-10-10 DIAGNOSIS — Z87442 Personal history of urinary calculi: Secondary | ICD-10-CM | POA: Diagnosis not present

## 2023-10-10 DIAGNOSIS — Z791 Long term (current) use of non-steroidal anti-inflammatories (NSAID): Secondary | ICD-10-CM | POA: Diagnosis not present

## 2023-10-10 DIAGNOSIS — E876 Hypokalemia: Secondary | ICD-10-CM | POA: Insufficient documentation

## 2023-10-10 DIAGNOSIS — T502X5A Adverse effect of carbonic-anhydrase inhibitors, benzothiadiazides and other diuretics, initial encounter: Secondary | ICD-10-CM | POA: Insufficient documentation

## 2023-10-10 DIAGNOSIS — K4020 Bilateral inguinal hernia, without obstruction or gangrene, not specified as recurrent: Secondary | ICD-10-CM | POA: Diagnosis not present

## 2023-10-10 DIAGNOSIS — I10 Essential (primary) hypertension: Secondary | ICD-10-CM | POA: Insufficient documentation

## 2023-10-10 DIAGNOSIS — E785 Hyperlipidemia, unspecified: Secondary | ICD-10-CM | POA: Diagnosis not present

## 2023-10-10 DIAGNOSIS — R109 Unspecified abdominal pain: Secondary | ICD-10-CM | POA: Diagnosis not present

## 2023-10-10 LAB — BASIC METABOLIC PANEL
Anion gap: 10 (ref 5–15)
BUN: 20 mg/dL (ref 6–20)
CO2: 23 mmol/L (ref 22–32)
Calcium: 9.6 mg/dL (ref 8.9–10.3)
Chloride: 105 mmol/L (ref 98–111)
Creatinine, Ser: 0.91 mg/dL (ref 0.61–1.24)
GFR, Estimated: >60 mL/min (ref >60)
Glucose, Bld: 103 mg/dL — ABNORMAL HIGH (ref 70–99)
Potassium: 3.9 mmol/L (ref 3.5–5.1)
Sodium: 138 mmol/L (ref 135–145)

## 2023-10-11 ENCOUNTER — Encounter (HOSPITAL_COMMUNITY): Payer: Self-pay | Admitting: General Surgery

## 2023-10-11 ENCOUNTER — Ambulatory Visit (HOSPITAL_COMMUNITY): Payer: BC Managed Care – PPO | Admitting: Certified Registered Nurse Anesthetist

## 2023-10-11 ENCOUNTER — Ambulatory Visit (HOSPITAL_COMMUNITY)
Admission: RE | Admit: 2023-10-11 | Discharge: 2023-10-11 | Disposition: A | Discharge status: Home / Self Care | Payer: BC Managed Care – PPO | Attending: General Surgery | Admitting: General Surgery

## 2023-10-11 ENCOUNTER — Other Ambulatory Visit: Payer: Self-pay

## 2023-10-11 ENCOUNTER — Encounter (HOSPITAL_COMMUNITY)
Admission: RE | Disposition: A | Discharge status: Home / Self Care | Payer: Self-pay | Source: Home / Self Care | Attending: General Surgery

## 2023-10-11 DIAGNOSIS — K4020 Bilateral inguinal hernia, without obstruction or gangrene, not specified as recurrent: Secondary | ICD-10-CM | POA: Diagnosis not present

## 2023-10-11 DIAGNOSIS — T502X5A Adverse effect of carbonic-anhydrase inhibitors, benzothiadiazides and other diuretics, initial encounter: Secondary | ICD-10-CM | POA: Diagnosis not present

## 2023-10-11 DIAGNOSIS — Z791 Long term (current) use of non-steroidal anti-inflammatories (NSAID): Secondary | ICD-10-CM | POA: Diagnosis not present

## 2023-10-11 DIAGNOSIS — E876 Hypokalemia: Secondary | ICD-10-CM | POA: Diagnosis not present

## 2023-10-11 DIAGNOSIS — Z79899 Other long term (current) drug therapy: Secondary | ICD-10-CM | POA: Insufficient documentation

## 2023-10-11 DIAGNOSIS — M549 Dorsalgia, unspecified: Secondary | ICD-10-CM | POA: Insufficient documentation

## 2023-10-11 DIAGNOSIS — I10 Essential (primary) hypertension: Secondary | ICD-10-CM | POA: Insufficient documentation

## 2023-10-11 DIAGNOSIS — Z87442 Personal history of urinary calculi: Secondary | ICD-10-CM | POA: Insufficient documentation

## 2023-10-11 DIAGNOSIS — E785 Hyperlipidemia, unspecified: Secondary | ICD-10-CM | POA: Insufficient documentation

## 2023-10-11 DIAGNOSIS — R109 Unspecified abdominal pain: Secondary | ICD-10-CM | POA: Insufficient documentation

## 2023-10-11 DIAGNOSIS — D176 Benign lipomatous neoplasm of spermatic cord: Secondary | ICD-10-CM | POA: Insufficient documentation

## 2023-10-11 HISTORY — PX: XI ROBOTIC ASSISTED INGUINAL HERNIA REPAIR WITH MESH: SHX6706

## 2023-10-11 SURGERY — REPAIR, HERNIA, INGUINAL, ROBOT-ASSISTED, LAPAROSCOPIC, USING MESH
Anesthesia: General | Site: Inguinal | Laterality: Bilateral

## 2023-10-11 MED ORDER — BUPIVACAINE HCL (PF) 0.5 % IJ SOLN
INTRAMUSCULAR | Status: DC | PRN
  Administered 2023-10-11: 30 mL

## 2023-10-11 MED ORDER — ONDANSETRON HCL 4 MG/2ML IJ SOLN
INTRAMUSCULAR | Status: AC
  Filled 2023-10-11: qty 2

## 2023-10-11 MED ORDER — GLYCOPYRROLATE PF 0.2 MG/ML IJ SOSY
PREFILLED_SYRINGE | INTRAMUSCULAR | Status: AC
  Filled 2023-10-11: qty 1

## 2023-10-11 MED ORDER — DEXMEDETOMIDINE HCL IN NACL 80 MCG/20ML IV SOLN
INTRAVENOUS | Status: DC | PRN
Start: 2023-10-11 — End: 2023-10-11
  Administered 2023-10-11: 8 ug via INTRAVENOUS
  Administered 2023-10-11: 12 ug via INTRAVENOUS

## 2023-10-11 MED ORDER — MIDAZOLAM HCL 2 MG/2ML IJ SOLN
INTRAMUSCULAR | Status: DC | PRN
  Administered 2023-10-11: 2 mg via INTRAVENOUS

## 2023-10-11 MED ORDER — LACTATED RINGERS IV SOLN: INTRAVENOUS | Status: DC | PRN

## 2023-10-11 MED ORDER — GLYCOPYRROLATE PF 0.2 MG/ML IJ SOSY
PREFILLED_SYRINGE | INTRAMUSCULAR | Status: DC | PRN
  Administered 2023-10-11: .2 mg via INTRAVENOUS

## 2023-10-11 MED ORDER — DEXAMETHASONE SODIUM PHOSPHATE 10 MG/ML IJ SOLN
INTRAMUSCULAR | Status: DC | PRN
  Administered 2023-10-11: 10 mg via INTRAVENOUS

## 2023-10-11 MED ORDER — CHLORHEXIDINE GLUCONATE 0.12 % MT SOLN
OROMUCOSAL | Status: AC
  Administered 2023-10-11: 15 mL
  Filled 2023-10-11: qty 15

## 2023-10-11 MED ORDER — FENTANYL CITRATE PF 50 MCG/ML IJ SOSY
25.0000 ug | PREFILLED_SYRINGE | INTRAMUSCULAR | Status: DC | PRN
  Administered 2023-10-11 (×3): 50 ug via INTRAVENOUS
  Filled 2023-10-11 (×3): qty 1

## 2023-10-11 MED ORDER — ACETAMINOPHEN 10 MG/ML IV SOLN
INTRAVENOUS | Status: AC
  Filled 2023-10-11: qty 100

## 2023-10-11 MED ORDER — ONDANSETRON HCL 4 MG/2ML IJ SOLN
INTRAMUSCULAR | Status: DC | PRN
  Administered 2023-10-11: 4 mg via INTRAVENOUS

## 2023-10-11 MED ORDER — ACETAMINOPHEN 10 MG/ML IV SOLN
INTRAVENOUS | Status: DC | PRN
  Administered 2023-10-11: 1000 mg via INTRAVENOUS

## 2023-10-11 MED ORDER — OXYCODONE HCL 5 MG PO TABS
5.0000 mg | ORAL_TABLET | Freq: Once | ORAL | Status: AC | PRN
  Administered 2023-10-11: 5 mg via ORAL
  Filled 2023-10-11: qty 1

## 2023-10-11 MED ORDER — METHOCARBAMOL 750 MG PO TABS: 750.0000 mg | ORAL_TABLET | Freq: Four times a day (QID) | ORAL | 1 refills | Status: DC

## 2023-10-11 MED ORDER — DEXMEDETOMIDINE HCL IN NACL 80 MCG/20ML IV SOLN
INTRAVENOUS | Status: AC
  Filled 2023-10-11: qty 20

## 2023-10-11 MED ORDER — LIDOCAINE HCL (CARDIAC) PF 100 MG/5ML IV SOSY
PREFILLED_SYRINGE | INTRAVENOUS | Status: DC | PRN
  Administered 2023-10-11: 100 mg via INTRATRACHEAL

## 2023-10-11 MED ORDER — MIDAZOLAM HCL 2 MG/2ML IJ SOLN
INTRAMUSCULAR | Status: AC
Start: 2023-10-11 — End: ?
  Filled 2023-10-11: qty 2

## 2023-10-11 MED ORDER — PROPOFOL 10 MG/ML IV BOLUS
INTRAVENOUS | Status: DC | PRN
  Administered 2023-10-11: 50 mg via INTRAVENOUS
  Administered 2023-10-11: 100 mg via INTRAVENOUS
  Administered 2023-10-11: 50 mg via INTRAVENOUS
  Administered 2023-10-11: 30 mg via INTRAVENOUS

## 2023-10-11 MED ORDER — FENTANYL CITRATE (PF) 250 MCG/5ML IJ SOLN
INTRAMUSCULAR | Status: DC | PRN
  Administered 2023-10-11: 100 ug via INTRAVENOUS
  Administered 2023-10-11: 50 ug via INTRAVENOUS
  Administered 2023-10-11: 100 ug via INTRAVENOUS

## 2023-10-11 MED ORDER — CHLORHEXIDINE GLUCONATE CLOTH 2 % EX PADS
6.0000 | MEDICATED_PAD | Freq: Once | CUTANEOUS | Status: AC
  Administered 2023-10-11: 2 via TOPICAL

## 2023-10-11 MED ORDER — OXYCODONE HCL 5 MG/5ML PO SOLN: 5.0000 mg | Freq: Once | ORAL | Status: AC | PRN

## 2023-10-11 MED ORDER — ROCURONIUM BROMIDE 10 MG/ML (PF) SYRINGE
PREFILLED_SYRINGE | INTRAVENOUS | Status: AC
  Filled 2023-10-11: qty 10

## 2023-10-11 MED ORDER — STERILE WATER FOR IRRIGATION IR SOLN
Status: DC | PRN
  Administered 2023-10-11: 500 mL

## 2023-10-11 MED ORDER — BUPIVACAINE HCL (PF) 0.5 % IJ SOLN
INTRAMUSCULAR | Status: AC
  Filled 2023-10-11: qty 30

## 2023-10-11 MED ORDER — PROPOFOL 10 MG/ML IV BOLUS
INTRAVENOUS | Status: AC
Start: 2023-10-11 — End: ?
  Filled 2023-10-11: qty 20

## 2023-10-11 MED ORDER — LIDOCAINE HCL (PF) 2 % IJ SOLN
INTRAMUSCULAR | Status: AC
  Filled 2023-10-11: qty 5

## 2023-10-11 MED ORDER — CEFAZOLIN SODIUM-DEXTROSE 2-4 GM/100ML-% IV SOLN
2.0000 g | INTRAVENOUS | Status: AC
  Administered 2023-10-11: 2 g via INTRAVENOUS

## 2023-10-11 MED ORDER — PROPOFOL 10 MG/ML IV BOLUS
INTRAVENOUS | Status: AC
  Filled 2023-10-11: qty 20

## 2023-10-11 MED ORDER — DEXAMETHASONE SODIUM PHOSPHATE 10 MG/ML IJ SOLN
INTRAMUSCULAR | Status: AC
  Filled 2023-10-11: qty 1

## 2023-10-11 MED ORDER — OXYCODONE HCL 5 MG PO TABS: 5.0000 mg | ORAL_TABLET | ORAL | 0 refills | Status: DC | PRN

## 2023-10-11 MED ORDER — CHLORHEXIDINE GLUCONATE CLOTH 2 % EX PADS: 6.0000 | MEDICATED_PAD | Freq: Once | CUTANEOUS | Status: DC

## 2023-10-11 MED ORDER — SUGAMMADEX SODIUM 200 MG/2ML IV SOLN
INTRAVENOUS | Status: DC | PRN
  Administered 2023-10-11: 200 mg via INTRAVENOUS

## 2023-10-11 MED ORDER — CEFAZOLIN SODIUM-DEXTROSE 2-4 GM/100ML-% IV SOLN
INTRAVENOUS | Status: AC
  Filled 2023-10-11: qty 100

## 2023-10-11 MED ORDER — KETOROLAC TROMETHAMINE 30 MG/ML IJ SOLN
30.0000 mg | Freq: Once | INTRAMUSCULAR | Status: AC
  Administered 2023-10-11: 30 mg via INTRAVENOUS
  Filled 2023-10-11: qty 1

## 2023-10-11 MED ORDER — FENTANYL CITRATE (PF) 250 MCG/5ML IJ SOLN
INTRAMUSCULAR | Status: AC
  Filled 2023-10-11: qty 5

## 2023-10-11 MED ORDER — ROCURONIUM BROMIDE 10 MG/ML (PF) SYRINGE
PREFILLED_SYRINGE | INTRAVENOUS | Status: DC | PRN
Start: 2023-10-11 — End: 2023-10-11
  Administered 2023-10-11: 20 mg via INTRAVENOUS
  Administered 2023-10-11: 100 mg via INTRAVENOUS
  Administered 2023-10-11: 20 mg via INTRAVENOUS

## 2023-10-11 MED ORDER — ONDANSETRON HCL 4 MG/2ML IJ SOLN: 4.0000 mg | Freq: Once | INTRAMUSCULAR | Status: DC | PRN

## 2023-10-11 SURGICAL SUPPLY — 45 items
CHLORAPREP W/TINT 26 (MISCELLANEOUS) ×1 IMPLANT
COVER LIGHT HANDLE STERIS (MISCELLANEOUS) ×1 IMPLANT
COVER MAYO STAND STRL (DRAPES) ×1 IMPLANT
COVER MAYO STAND XLG (MISCELLANEOUS) ×1 IMPLANT
COVER TIP SHEARS 8 DVNC (MISCELLANEOUS) ×1 IMPLANT
DERMABOND ADVANCED .7 DNX12 (GAUZE/BANDAGES/DRESSINGS) ×1 IMPLANT
DRAPE ARM DVNC X/XI (DISPOSABLE) ×3 IMPLANT
DRAPE COLUMN DVNC XI (DISPOSABLE) ×1 IMPLANT
DRIVER NDL MEGA SUTCUT DVNCXI (INSTRUMENTS) ×1 IMPLANT
DRIVER NDLE MEGA SUTCUT DVNCXI (INSTRUMENTS) ×1
ELECT REM PT RETURN 9FT ADLT (ELECTROSURGICAL) ×1
ELECTRODE REM PT RTRN 9FT ADLT (ELECTROSURGICAL) ×1 IMPLANT
FORCEPS BPLR R/ABLATION 8 DVNC (INSTRUMENTS) ×1 IMPLANT
GAUZE SPONGE 4X4 12PLY STRL (GAUZE/BANDAGES/DRESSINGS) ×1 IMPLANT
GLOVE BIOGEL PI IND STRL 7.0 (GLOVE) ×4 IMPLANT
GLOVE BIOGEL PI IND STRL 7.5 (GLOVE) IMPLANT
GLOVE SURG SS PI 7.0 STRL IVOR (GLOVE) IMPLANT
GLOVE SURG SS PI 7.5 STRL IVOR (GLOVE) ×2 IMPLANT
GOWN STRL REUS W/TWL LRG LVL3 (GOWN DISPOSABLE) ×2 IMPLANT
KIT PINK PAD W/HEAD ARE REST (MISCELLANEOUS) ×1
KIT PINK PAD W/HEAD ARM REST (MISCELLANEOUS) ×1 IMPLANT
KIT TURNOVER KIT A (KITS) ×1 IMPLANT
MANIFOLD NEPTUNE II (INSTRUMENTS) ×1 IMPLANT
MESH 3DMAX MID 5X7 LT XLRG (Mesh General) IMPLANT
MESH 3DMAX MID 5X7 RT XLRG (Mesh General) IMPLANT
NDL HYPO 21X1.5 SAFETY (NEEDLE) ×1 IMPLANT
NDL INSUFFLATION 14GA 120MM (NEEDLE) ×1 IMPLANT
NEEDLE HYPO 21X1.5 SAFETY (NEEDLE) ×1
NEEDLE INSUFFLATION 14GA 120MM (NEEDLE) ×1
OBTURATOR OPTICAL STND 8 DVNC (TROCAR) ×1
OBTURATOR OPTICALSTD 8 DVNC (TROCAR) ×1 IMPLANT
PACK LAP CHOLE LZT030E (CUSTOM PROCEDURE TRAY) ×1 IMPLANT
PENCIL HANDSWITCHING (ELECTRODE) ×1 IMPLANT
SCISSORS MNPLR CVD DVNC XI (INSTRUMENTS) ×1 IMPLANT
SEAL UNIV 5-12 XI (MISCELLANEOUS) ×3 IMPLANT
SET BASIN LINEN APH (SET/KITS/TRAYS/PACK) ×1 IMPLANT
SET TUBE SMOKE EVAC HIGH FLOW (TUBING) ×1 IMPLANT
SOL PREP POV-IOD 4OZ 10% (MISCELLANEOUS) ×1 IMPLANT
SUT MNCRL AB 4-0 PS2 18 (SUTURE) ×2 IMPLANT
SUT V-LOC 90 ABS 3-0 VLT V-20 (SUTURE) ×2 IMPLANT
SUT VIC AB 2-0 SH 27X BRD (SUTURE) ×1 IMPLANT
SYR 30ML LL (SYRINGE) ×1 IMPLANT
TAPE TRANSPORE STRL 2 31045 (GAUZE/BANDAGES/DRESSINGS) ×1 IMPLANT
TRAY FOL W/BAG SLVR 16FR STRL (SET/KITS/TRAYS/PACK) ×1 IMPLANT
WATER STERILE IRR 500ML POUR (IV SOLUTION) ×1 IMPLANT

## 2023-10-11 NOTE — Anesthesia Preprocedure Evaluation (Signed)
Anesthesia Evaluation  Patient identified by MRN, date of birth, ID band Patient awake    Reviewed: Allergy & Precautions, H&P , NPO status , Patient's Chart, lab work & pertinent test results, reviewed documented beta blocker date and time   Airway Mallampati: II  TM Distance: >3 FB Neck ROM: full    Dental no notable dental hx.    Pulmonary neg pulmonary ROS   Pulmonary exam normal breath sounds clear to auscultation       Cardiovascular Exercise Tolerance: Good hypertension, negative cardio ROS  Rhythm:regular Rate:Normal     Neuro/Psych negative neurological ROS  negative psych ROS   GI/Hepatic negative GI ROS, Neg liver ROS,,,  Endo/Other  negative endocrine ROS    Renal/GU negative Renal ROS  negative genitourinary   Musculoskeletal   Abdominal   Peds  Hematology negative hematology ROS (+)   Anesthesia Other Findings   Reproductive/Obstetrics negative OB ROS                             Anesthesia Physical Anesthesia Plan  ASA: 2  Anesthesia Plan: General and General ETT   Post-op Pain Management:    Induction:   PONV Risk Score and Plan: Ondansetron  Airway Management Planned:   Additional Equipment:   Intra-op Plan:   Post-operative Plan:   Informed Consent: I have reviewed the patients History and Physical, chart, labs and discussed the procedure including the risks, benefits and alternatives for the proposed anesthesia with the patient or authorized representative who has indicated his/her understanding and acceptance.     Dental Advisory Given  Plan Discussed with: CRNA  Anesthesia Plan Comments:        Anesthesia Quick Evaluation

## 2023-10-11 NOTE — Op Note (Signed)
Patient:  Jacob Wood  DOB:  1966-08-23  MRN:  [id number]   Preop Diagnosis: Bilateral inguinal hernias  Postop Diagnosis: Same  Procedure: Robotic assisted laparoscopic bilateral inguinal herniorrhaphies with mesh  Surgeon: Franky Macho, MD  Anes: General Endotracheal  Indications: Patient is a 57 year old white male who presents with bilateral inguinal hernias.  The risks and benefits of the procedure including bleeding, infection, mesh use, the possibility of an open procedure were fully explained to the patient, who gave informed consent.  Procedure note: The patient was placed in the supine position.  After induction of general endotracheal anesthesia, the abdomen was prepped and draped using the usual sterile technique with ChloraPrep.  Surgical site confirmation was performed.  An incision was made in the left upper quadrant at Palmer's point.  A Veress needle was introduced into the abdominal cavity and confirmation of placement was done using the saline drop test.  The abdomen was then insufflated to 15 mmHg pressure.  An 8 mm trocar was introduced into the abdominal cavity under direct visualization without difficulty.  Additional 8 mm trocars were placed in the upper mid abdomen and right upper quadrant regions.  The robot was then docked and targeted.  The patient was noted to have bilateral fat-containing inguinal hernias which were indirect in nature.  I first turned my attention to the left inguinal hernia.  A peritoneal flap was then formed down to Cooper's ligament.  I was able to cross midline approximately 2 cm.  The lipoma of the cord was freed away from the spermatic cord up to the peritoneum.  A 6 cm posterior dissection of the peritoneum off the spermatic cord was performed.  An extra-large Bard 3D max mesh was then inserted and secured to the anterior abdominal wall using a 2-0 Vicryl suture.  Good approximation of the mesh was noted over the hernia defect.  The  peritoneal layer was reapproximated using a 3-0 V-Loc running suture.  I then proceeded to perform a right inguinal hernia.  The peritoneal layer was taken down to Cooper's ligament.  A smaller lipoma of the cord was found and this was freed away from the spermatic cord up to the peritoneal flap posteriorly.  Again, an extra-large Bard 3D max mesh was inserted and secured to Cooper's ligament using a 2-0 Vicryl interrupted suture.  The peritoneum was then reapproximated using a 3-0 V-Loc running suture.  The robot was undocked and all air was evacuated from the abdominal cavity prior to the removal of the trocars.  All wounds were irrigated with normal saline.  All wounds were injected with 0.5% Sensorcaine.  All incisions were closed using a 4-0 Monocryl subcuticular suture.  Dermabond was applied.  All tape and needle counts were correct at the end of the procedure.  The patient was extubated in the operating room and transferred to PACU in stable condition.  Complications: None  EBL: Minimal  Specimen: None

## 2023-10-11 NOTE — Anesthesia Procedure Notes (Signed)
Procedure Name: Intubation Date/Time: 10/11/2023 10:32 AM  Performed by: Chelsea Aus, CRNAPre-anesthesia Checklist: Patient identified, Emergency Drugs available, Suction available and Patient being monitored Patient Re-evaluated:Patient Re-evaluated prior to induction Oxygen Delivery Method: Circle system utilized Preoxygenation: Pre-oxygenation with 100% oxygen Induction Type: IV induction Ventilation: Mask ventilation without difficulty and Oral airway inserted - appropriate to patient size Laryngoscope Size: Mac and 4 Grade View: Grade I Tube type: Oral Tube size: 7.0 mm Number of attempts: 1 Intubation method: 100 mm OA. Placement Confirmation: ETT inserted through vocal cords under direct vision, positive ETCO2 and breath sounds checked- equal and bilateral Secured at: 25 cm Tube secured with: Tape Dental Injury: Teeth and Oropharynx as per pre-operative assessment

## 2023-10-11 NOTE — Transfer of Care (Signed)
Immediate Anesthesia Transfer of Care Note  Patient: Jacob Wood  Procedure(s) Performed: XI ROBOTIC ASSISTED BILATERAL INGUINAL HERNIA REPAIR WITH MESH (Bilateral: Inguinal)  Patient Location: PACU  Anesthesia Type:General  Level of Consciousness: drowsy, patient cooperative, and responds to stimulation  Airway & Oxygen Therapy: Patient Spontanous Breathing and Patient connected to nasal cannula oxygen  Post-op Assessment: Report given to RN and Post -op Vital signs reviewed and stable  Post vital signs: Reviewed and stable  Last Vitals:  Vitals Value Taken Time  BP 119/77 10/11/23 1312  Temp 36.7 C 10/11/23 1312  Pulse 88 10/11/23 1312  Resp 16 10/11/23 1312  SpO2 96 % 10/11/23 1312  Vitals shown include unfiled device data.  Last Pain:  Vitals:   10/11/23 0836  TempSrc: Oral  PainSc: 0-No pain         Complications: No notable events documented.

## 2023-10-11 NOTE — Interval H&P Note (Signed)
History and Physical Interval Note:  10/11/2023 9:26 AM  Jacob Wood  has presented today for surgery, with the diagnosis of HERNIA, INGUINAL, BILATERAL.  The various methods of treatment have been discussed with the patient and family. After consideration of risks, benefits and other options for treatment, the patient has consented to  Procedure(s): XI ROBOTIC ASSISTED BILATERAL INGUINAL HERNIA REPAIR WITH MESH (Bilateral) as a surgical intervention.  The patient's history has been reviewed, patient examined, no change in status, stable for surgery.  I have reviewed the patient's chart and labs.  Questions were answered to the patient's satisfaction.     Franky Macho

## 2023-10-12 ENCOUNTER — Telehealth: Payer: Self-pay | Admitting: Family Medicine

## 2023-10-12 NOTE — Telephone Encounter (Signed)
FMLA paperwork completed and faxed to [employer] at [phone]. Confirmation received.   Out of work starting 10/11/2023 and may return to work unrestricted on 11/21/2023.

## 2023-10-16 NOTE — Anesthesia Postprocedure Evaluation (Signed)
Anesthesia Post Note  Patient: Jacob Wood  Procedure(s) Performed: XI ROBOTIC ASSISTED BILATERAL INGUINAL HERNIA REPAIR WITH MESH (Bilateral: Inguinal)  Patient location during evaluation: Phase II Anesthesia Type: General Level of consciousness: awake Pain management: pain level controlled Vital Signs Assessment: post-procedure vital signs reviewed and stable Respiratory status: spontaneous breathing and respiratory function stable Cardiovascular status: blood pressure returned to baseline and stable Postop Assessment: no headache and no apparent nausea or vomiting Anesthetic complications: no Comments: Late entry   No notable events documented.   Last Vitals:  Vitals:   10/11/23 1415 10/11/23 1435  BP: 114/79 127/87  Pulse: 92 90  Resp: (!) 22 16  Temp:  36.7 C  SpO2: 97% 96%    Last Pain:  Vitals:   10/12/23 1431  TempSrc:   PainSc: 5                  Windell Norfolk

## 2023-10-18 ENCOUNTER — Encounter: Payer: Self-pay | Admitting: General Surgery

## 2023-10-18 ENCOUNTER — Ambulatory Visit (INDEPENDENT_AMBULATORY_CARE_PROVIDER_SITE_OTHER): Payer: BC Managed Care – PPO | Admitting: General Surgery

## 2023-10-18 VITALS — BP 121/83 | HR 70 | Temp 97.8°F | Resp 12 | Ht 73.0 in | Wt 231.0 lb

## 2023-10-18 DIAGNOSIS — Z09 Encounter for follow-up examination after completed treatment for conditions other than malignant neoplasm: Secondary | ICD-10-CM

## 2023-10-18 MED ORDER — OXYCODONE HCL 5 MG PO TABS: 5.0000 mg | ORAL_TABLET | ORAL | 0 refills | Status: DC | PRN

## 2023-10-19 ENCOUNTER — Encounter (HOSPITAL_COMMUNITY): Payer: Self-pay | Admitting: General Surgery

## 2023-10-19 NOTE — Progress Notes (Signed)
Subjective:     Jacob Wood  Patient here for postoperative visit, status post robotic assisted laparoscopic bilateral inguinal herniorrhaphy with mesh.  Patient is having some incisional and groin pain.  Minimal swelling noted. Objective:    BP 121/83   Pulse 70   Temp 97.8 F (36.6 C) (Oral)   Resp 12   Ht 6\' 1"  (1.854 m)   Wt 231 lb (104.8 kg)   SpO2 95%   BMI 30.48 kg/m   General:  alert, cooperative, and no distress  Abdomen soft, incision is healing well.  Resolving ecchymosis in the perineum noted.  No hematomas present.     Assessment:    Doing well postoperatively. Patient having expected pain from having both hernias fixed.    Plan:   May increase activity as able.  Will reorder patient's oxycodone for pain.  Follow-up here on 11/08/2023.

## 2023-11-08 ENCOUNTER — Ambulatory Visit (INDEPENDENT_AMBULATORY_CARE_PROVIDER_SITE_OTHER): Payer: BC Managed Care – PPO | Admitting: General Surgery

## 2023-11-08 ENCOUNTER — Encounter: Payer: Self-pay | Admitting: General Surgery

## 2023-11-08 ENCOUNTER — Encounter: Payer: Self-pay | Admitting: *Deleted

## 2023-11-08 VITALS — BP 130/81 | HR 91 | Temp 98.3°F | Resp 14 | Ht 73.0 in | Wt 237.0 lb

## 2023-11-08 DIAGNOSIS — Z09 Encounter for follow-up examination after completed treatment for conditions other than malignant neoplasm: Secondary | ICD-10-CM

## 2023-11-08 NOTE — Progress Notes (Signed)
 Subjective:     Jacob Wood  Patient here for postoperative visit, status post robotic assisted laparoscopic bilateral inguinal herniorrhaphies with mesh.  Patient is doing very well.  He has minimal groin pain.  He states this is resolving.  He is increasing his activity as able. Objective:    BP 130/81   Pulse 91   Temp 98.3 F (36.8 C) (Oral)   Resp 14   Ht 6' 1 (1.854 m)   Wt 237 lb (107.5 kg)   SpO2 95%   BMI 31.27 kg/m   General:  alert, cooperative, and no distress  Abdomen soft, incisions healing well.  No seromas or hematomas in groin region.     Assessment:    Doing well postoperatively.    Plan:   May return to work without restrictions on 11/21/2023.  Follow-up here as needed.

## 2023-11-29 NOTE — Telephone Encounter (Signed)
Received paperwork from the standard - completed and faxed to [phone]. Confirmation received.   Out of work starting 10/11/2023 and may return to work unrestricted on 11/21/2023.

## 2023-12-02 ENCOUNTER — Other Ambulatory Visit: Payer: Self-pay | Admitting: Urology

## 2024-01-03 DIAGNOSIS — M5412 Radiculopathy, cervical region: Secondary | ICD-10-CM | POA: Diagnosis not present

## 2024-01-03 DIAGNOSIS — M25522 Pain in left elbow: Secondary | ICD-10-CM | POA: Diagnosis not present

## 2024-01-04 ENCOUNTER — Other Ambulatory Visit: Payer: Self-pay | Admitting: Family Medicine

## 2024-01-04 DIAGNOSIS — E782 Mixed hyperlipidemia: Secondary | ICD-10-CM

## 2024-01-18 ENCOUNTER — Ambulatory Visit (INDEPENDENT_AMBULATORY_CARE_PROVIDER_SITE_OTHER): Payer: BC Managed Care – PPO | Admitting: Family Medicine

## 2024-01-18 ENCOUNTER — Encounter: Payer: Self-pay | Admitting: Family Medicine

## 2024-01-18 VITALS — BP 125/74 | HR 86 | Ht 73.0 in | Wt 238.0 lb

## 2024-01-18 DIAGNOSIS — Z0001 Encounter for general adult medical examination with abnormal findings: Secondary | ICD-10-CM

## 2024-01-18 DIAGNOSIS — I10 Essential (primary) hypertension: Secondary | ICD-10-CM | POA: Diagnosis not present

## 2024-01-18 DIAGNOSIS — E782 Mixed hyperlipidemia: Secondary | ICD-10-CM

## 2024-01-18 DIAGNOSIS — Z0000 Encounter for general adult medical examination without abnormal findings: Secondary | ICD-10-CM

## 2024-01-18 MED ORDER — LOSARTAN POTASSIUM 50 MG PO TABS: 50.0000 mg | ORAL_TABLET | Freq: Every day | ORAL | 3 refills | Status: AC

## 2024-01-18 MED ORDER — ROSUVASTATIN CALCIUM 40 MG PO TABS: 40.0000 mg | ORAL_TABLET | Freq: Every day | ORAL | 0 refills | Status: DC

## 2024-01-18 NOTE — Progress Notes (Signed)
 BP 125/74   Pulse 86   Ht 6\' 1"  (1.854 m)   Wt 238 lb (108 kg)   SpO2 96%   BMI 31.40 kg/m    Subjective:   Patient ID: Jacob Wood, male    DOB: 28-Aug-1966, 58 y.o.   MRN: [id number]  HPI: Jacob Wood is a 58 y.o. male presenting on 01/18/2024 for Medical Management of Chronic Issues (CPE)   HPI Physical exam Patient denies any chest pain, shortness of breath, headaches or vision issues, abdominal complaints, diarrhea, nausea, vomiting, or joint issues.   Hypertension Patient is currently on losartan, and their blood pressure today is 125/74. Patient denies any lightheadedness or dizziness. Patient denies headaches, blurred vision, chest pains, shortness of breath, or weakness. Denies any side effects from medication and is content with current medication.   Hyperlipidemia Patient is coming in for recheck of his hyperlipidemia. The patient is currently taking Crestor. They deny any issues with myalgias or history of liver damage from it. They deny any focal numbness or weakness or chest pain.   Relevant past medical, surgical, family and social history reviewed and updated as indicated. Interim medical history since our last visit reviewed. Allergies and medications reviewed and updated.  Review of Systems  Constitutional:  Negative for chills and fever.  HENT:  Negative for ear pain and tinnitus.   Eyes:  Negative for pain and discharge.  Respiratory:  Negative for cough, shortness of breath and wheezing.   Cardiovascular:  Negative for chest pain, palpitations and leg swelling.  Gastrointestinal:  Negative for abdominal pain, blood in stool, constipation and diarrhea.  Genitourinary:  Negative for dysuria and hematuria.  Musculoskeletal:  Negative for back pain, gait problem and myalgias.  Skin:  Negative for rash.  Neurological:  Negative for dizziness, weakness and headaches.  Psychiatric/Behavioral:  Negative for suicidal ideas.   All other systems reviewed and  are negative.   Per HPI unless specifically indicated above   Allergies as of 01/18/2024   No Known Allergies      Medication List        Accurate as of January 18, 2024  3:32 PM. If you have any questions, ask your nurse or doctor.          STOP taking these medications    meloxicam 7.5 MG tablet Commonly known as: MOBIC Stopped by: Elige Radon Martesha Niedermeier   methocarbamol 750 MG tablet Commonly known as: Robaxin-750 Stopped by: Elige Radon Mande Auvil       TAKE these medications    Fish Oil 1000 MG Caps Take 1,000 mg by mouth daily.   losartan 50 MG tablet Commonly known as: COZAAR Take 1 tablet (50 mg total) by mouth daily.   multivitamin capsule Take 1 capsule by mouth daily.   QC TUMERIC COMPLEX PO Take 1 capsule by mouth daily.   rosuvastatin 40 MG tablet Commonly known as: CRESTOR Take 1 tablet (40 mg total) by mouth daily.         Objective:   BP 125/74   Pulse 86   Ht 6\' 1"  (1.854 m)   Wt 238 lb (108 kg)   SpO2 96%   BMI 31.40 kg/m   Wt Readings from Last 3 Encounters:  01/18/24 238 lb (108 kg)  11/08/23 237 lb (107.5 kg)  10/18/23 231 lb (104.8 kg)    Physical Exam Vitals reviewed.  Constitutional:      General: He is not in acute distress.    Appearance: He is  well-developed. He is not diaphoretic.  HENT:     Right Ear: External ear normal.     Left Ear: External ear normal.     Nose: Nose normal.     Mouth/Throat:     Pharynx: No oropharyngeal exudate.  Eyes:     General: No scleral icterus.    Conjunctiva/sclera: Conjunctivae normal.  Neck:     Thyroid: No thyromegaly.  Cardiovascular:     Rate and Rhythm: Normal rate and regular rhythm.     Heart sounds: Normal heart sounds. No murmur heard. Pulmonary:     Effort: Pulmonary effort is normal. No respiratory distress.     Breath sounds: Normal breath sounds. No wheezing.  Abdominal:     General: Bowel sounds are normal. There is no distension.     Palpations: Abdomen is  soft.     Tenderness: There is no abdominal tenderness. There is no guarding or rebound.  Musculoskeletal:        General: No swelling. Normal range of motion.     Cervical back: Neck supple.  Lymphadenopathy:     Cervical: No cervical adenopathy.  Skin:    General: Skin is warm and dry.     Findings: No rash.  Neurological:     Mental Status: He is alert and oriented to person, place, and time.     Coordination: Coordination normal.  Psychiatric:        Behavior: Behavior normal.       Assessment & Plan:   Problem List Items Addressed This Visit       Cardiovascular and Mediastinum   Essential hypertension   Relevant Medications   losartan (COZAAR) 50 MG tablet   rosuvastatin (CRESTOR) 40 MG tablet   Other Relevant Orders   CBC with Differential/Platelet   CMP14+EGFR   Lipid panel   PSA, total and free     Other   Hyperlipidemia   Relevant Medications   losartan (COZAAR) 50 MG tablet   rosuvastatin (CRESTOR) 40 MG tablet   Other Relevant Orders   CBC with Differential/Platelet   CMP14+EGFR   Lipid panel   PSA, total and free   Other Visit Diagnoses       Physical exam    -  Primary   Relevant Orders   CBC with Differential/Platelet   CMP14+EGFR   Lipid panel   PSA, total and free       Everything seems to be doing well for him, blood pressure and heart rate and oxygen all look good.  Continue current medicine, will check blood work today. Follow up plan: Return in about 6 months (around 07/20/2024), or if symptoms worsen or fail to improve, for Hypertension and hyperlipidemia.  Counseling provided for all of the vaccine components Orders Placed This Encounter  Procedures   CBC with Differential/Platelet   CMP14+EGFR   Lipid panel   PSA, total and free    Arville Care, MD [place] [facility] 01/18/2024, 3:32 PM

## 2024-01-19 LAB — PSA, TOTAL AND FREE
PSA, Free Pct: 31.5 %
PSA, Free: 0.41 ng/mL
Prostate Specific Ag, Serum: 1.3 ng/mL (ref 0.0–4.0)

## 2024-01-19 LAB — CBC WITH DIFFERENTIAL/PLATELET
Basophils Absolute: 0 10*3/uL (ref 0.0–0.2)
Basos: 1 %
EOS (ABSOLUTE): 0.1 10*3/uL (ref 0.0–0.4)
Eos: 1 %
Hematocrit: 43.2 % (ref 37.5–51.0)
Hemoglobin: 14.5 g/dL (ref 13.0–17.7)
Immature Grans (Abs): 0 10*3/uL (ref 0.0–0.1)
Immature Granulocytes: 0 %
Lymphocytes Absolute: 1.5 10*3/uL (ref 0.7–3.1)
Lymphs: 21 %
MCH: 31.5 pg (ref 26.6–33.0)
MCHC: 33.6 g/dL (ref 31.5–35.7)
MCV: 94 fL (ref 79–97)
Monocytes Absolute: 0.5 10*3/uL (ref 0.1–0.9)
Monocytes: 7 %
Neutrophils Absolute: 5.2 10*3/uL (ref 1.4–7.0)
Neutrophils: 70 %
Platelets: 199 10*3/uL (ref 150–450)
RBC: 4.6 x10E6/uL (ref 4.14–5.80)
RDW: 13 % (ref 11.6–15.4)
WBC: 7.4 10*3/uL (ref 3.4–10.8)

## 2024-01-19 LAB — CMP14+EGFR
ALT: 33 IU/L (ref 0–44)
AST: 31 IU/L (ref 0–40)
Albumin: 4.7 g/dL (ref 3.8–4.9)
Alkaline Phosphatase: 56 IU/L (ref 44–121)
BUN/Creatinine Ratio: 15 (ref 9–20)
BUN: 17 mg/dL (ref 6–24)
Bilirubin Total: 0.5 mg/dL (ref 0.0–1.2)
CO2: 23 mmol/L (ref 20–29)
Calcium: 10.1 mg/dL (ref 8.7–10.2)
Chloride: 104 mmol/L (ref 96–106)
Creatinine, Ser: 1.17 mg/dL (ref 0.76–1.27)
Globulin, Total: 2.3 g/dL (ref 1.5–4.5)
Glucose: 85 mg/dL (ref 70–99)
Potassium: 4.4 mmol/L (ref 3.5–5.2)
Sodium: 142 mmol/L (ref 134–144)
Total Protein: 7 g/dL (ref 6.0–8.5)
eGFR: 74 mL/min/{1.73_m2} (ref >59)

## 2024-01-19 LAB — LIPID PANEL
Chol/HDL Ratio: 3.4 ratio (ref 0.0–5.0)
Cholesterol, Total: 180 mg/dL (ref 100–199)
HDL: 53 mg/dL (ref >39)
LDL Chol Calc (NIH): 85 mg/dL (ref 0–99)
Triglycerides: 258 mg/dL — ABNORMAL HIGH (ref 0–149)
VLDL Cholesterol Cal: 42 mg/dL — ABNORMAL HIGH (ref 5–40)

## 2024-01-23 ENCOUNTER — Encounter: Payer: Self-pay | Admitting: Family Medicine

## 2024-07-12 ENCOUNTER — Other Ambulatory Visit: Payer: Self-pay | Admitting: Family Medicine

## 2024-07-12 DIAGNOSIS — E782 Mixed hyperlipidemia: Secondary | ICD-10-CM

## 2024-07-20 ENCOUNTER — Encounter: Payer: Self-pay | Admitting: Family Medicine

## 2024-07-20 ENCOUNTER — Ambulatory Visit (INDEPENDENT_AMBULATORY_CARE_PROVIDER_SITE_OTHER): Admitting: Family Medicine

## 2024-07-20 VITALS — BP 135/78 | HR 72 | Ht 73.0 in | Wt 231.0 lb

## 2024-07-20 DIAGNOSIS — Z23 Encounter for immunization: Secondary | ICD-10-CM | POA: Diagnosis not present

## 2024-07-20 DIAGNOSIS — I10 Essential (primary) hypertension: Secondary | ICD-10-CM

## 2024-07-20 DIAGNOSIS — E782 Mixed hyperlipidemia: Secondary | ICD-10-CM

## 2024-07-20 DIAGNOSIS — M7701 Medial epicondylitis, right elbow: Secondary | ICD-10-CM | POA: Diagnosis not present

## 2024-07-20 LAB — LIPID PANEL

## 2024-07-20 MED ORDER — ROSUVASTATIN CALCIUM 40 MG PO TABS: 40.0000 mg | ORAL_TABLET | Freq: Every day | ORAL | 3 refills | Status: AC

## 2024-07-20 MED ORDER — PREDNISONE 20 MG PO TABS: ORAL_TABLET | ORAL | 0 refills | Status: AC

## 2024-07-20 NOTE — Progress Notes (Signed)
 BP 135/78   Pulse 72   Ht 6' 1 (1.854 m)   Wt 231 lb (104.8 kg)   SpO2 96%   BMI 30.48 kg/m    Subjective:   Patient ID: Jacob Wood, male    DOB: 01-28-1966, 58 y.o.   MRN: [id number]  HPI: Jacob Wood is a 58 y.o. male presenting on 07/20/2024 for Medical Management of Chronic Issues, Hyperlipidemia, and Hypertension   Discussed the use of AI scribe software for clinical note transcription with the patient, who gave verbal consent to proceed.  History of Present Illness   Jacob Wood is a 58 year old male with hypertension who presents for a recheck and blood work.  He is currently taking 50 mg of losartan  and reports that his home blood pressure readings are typically around 130/80 mmHg. He checks his blood pressure occasionally when at [facility]. He is also taking Crestor  and fish oils for cholesterol management and reports no side effects from these medications.  He experiences significant elbow pain, which he describes as starting at the inside of the elbow, with associated tingling and numbness in his fingers. He recalls breaking both 'funny bones' when he was younger, with his mother mentioning he broke one twice. The current pain is persistent from morning until night and is exacerbated by certain movements, particularly when pushing back against resistance. He notes a clicking sensation in the elbow that is not present in the other arm. He has tried Aleve  and ibuprofen for a week without relief and is hesitant to continue these medications due to lack of efficacy. He has not been to the gym for a month and a half due to the pain, which makes workouts unenjoyable. He wears a compression sleeve at work, which provides some relief, but the pain returns once removed. He has been using his left arm more to compensate for the pain in his right elbow.  His social history includes being active until the onset of his elbow pain, which has limited his ability to exercise.           Relevant past medical, surgical, family and social history reviewed and updated as indicated. Interim medical history since our last visit reviewed. Allergies and medications reviewed and updated.  Review of Systems  Constitutional:  Negative for chills and fever.  Eyes:  Negative for visual disturbance.  Respiratory:  Negative for shortness of breath and wheezing.   Cardiovascular:  Negative for chest pain and leg swelling.  Skin:  Negative for rash.  Neurological:  Negative for dizziness and light-headedness.  Psychiatric/Behavioral:  Negative for decreased concentration. The patient is not nervous/anxious.   All other systems reviewed and are negative.   Per HPI unless specifically indicated above   Allergies as of 07/20/2024   No Known Allergies      Medication List        Accurate as of July 20, 2024  2:14 PM. If you have any questions, ask your nurse or doctor.          Fish Oil 1000 MG Caps Take 1,000 mg by mouth daily.   losartan  50 MG tablet Commonly known as: COZAAR  Take 1 tablet (50 mg total) by mouth daily.   multivitamin capsule Take 1 capsule by mouth daily.   predniSONE  20 MG tablet Commonly known as: DELTASONE  2 po at same time daily for 5 days Started by: Fonda LABOR Frantz Quattrone   QC TUMERIC COMPLEX PO Take 1 capsule by mouth daily.  rosuvastatin  40 MG tablet Commonly known as: CRESTOR  Take 1 tablet (40 mg total) by mouth daily.         Objective:   BP 135/78   Pulse 72   Ht 6' 1 (1.854 m)   Wt 231 lb (104.8 kg)   SpO2 96%   BMI 30.48 kg/m   Wt Readings from Last 3 Encounters:  07/20/24 231 lb (104.8 kg)  01/18/24 238 lb (108 kg)  11/08/23 237 lb (107.5 kg)    Physical Exam Vitals and nursing note reviewed.  Constitutional:      General: He is not in acute distress.    Appearance: He is well-developed. He is not diaphoretic.  Eyes:     General: No scleral icterus.    Conjunctiva/sclera: Conjunctivae normal.   Neck:     Thyroid: No thyromegaly.  Cardiovascular:     Rate and Rhythm: Normal rate and regular rhythm.     Heart sounds: Normal heart sounds. No murmur heard. Pulmonary:     Effort: Pulmonary effort is normal. No respiratory distress.     Breath sounds: Normal breath sounds. No wheezing.  Musculoskeletal:     Right elbow: No swelling or deformity. Tenderness present in medial epicondyle.     Cervical back: Neck supple.  Lymphadenopathy:     Cervical: No cervical adenopathy.  Skin:    General: Skin is warm and dry.     Findings: No rash.  Neurological:     Mental Status: He is alert and oriented to person, place, and time.     Coordination: Coordination normal.  Psychiatric:        Behavior: Behavior normal.    Physical Exam   VITALS: BP- 135/78 NECK: No cervical adenopathy. CHEST: Lungs clear to auscultation bilaterally. CARDIOVASCULAR: Regular rate and rhythm, no murmurs. EXTREMITIES: No edema, good peripheral pulses.         Assessment & Plan:   Problem List Items Addressed This Visit       Cardiovascular and Mediastinum   Essential hypertension - Primary   Relevant Medications   rosuvastatin  (CRESTOR ) 40 MG tablet   Other Relevant Orders   CBC with Differential/Platelet   CMP14+EGFR   Lipid panel     Other   Hyperlipidemia   Relevant Medications   rosuvastatin  (CRESTOR ) 40 MG tablet   Other Relevant Orders   CBC with Differential/Platelet   CMP14+EGFR   Lipid panel   Other Visit Diagnoses       Medial epicondylitis, right elbow       Relevant Medications   predniSONE  (DELTASONE ) 20 MG tablet           Lateral epicondylitis, right elbow Chronic lateral epicondylitis with ulnar nerve compression symptoms. NSAIDs ineffective. Prefers oral prednisone  over injections. Surgical intervention discussed if conservative measures fail. - Prescribe oral prednisone , 2 pills daily for 5 days, with food in the morning. - Advise icing elbow to reduce  inflammation. - Encourage use of compression sleeve. - Recommend reducing exacerbating activities. - Provide recovery exercises. - Discuss potential surgical intervention if needed.  Essential hypertension Blood pressure well-controlled on 50 mg losartan  daily. Home readings consistent with office measurements.  Mixed hyperlipidemia Managed with Crestor  and fish oil. No side effects reported. - Order blood work to monitor lipid levels.          Follow up plan: Return in about 6 months (around 01/17/2025), or if symptoms worsen or fail to improve, for Exam physical.  Counseling provided for all of  the vaccine components Orders Placed This Encounter  Procedures   CBC with Differential/Platelet   CMP14+EGFR   Lipid panel    Fonda Levins, MD [facility] 07/20/2024, 2:14 PM

## 2024-07-21 LAB — CBC WITH DIFFERENTIAL/PLATELET
Basophils Absolute: 0 x10E3/uL (ref 0.0–0.2)
Basos: 1 %
EOS (ABSOLUTE): 0.1 x10E3/uL (ref 0.0–0.4)
Eos: 1 %
Hematocrit: 43.1 % (ref 37.5–51.0)
Hemoglobin: 14.3 g/dL (ref 13.0–17.7)
Immature Grans (Abs): 0 x10E3/uL (ref 0.0–0.1)
Immature Granulocytes: 0 %
Lymphocytes Absolute: 1.6 x10E3/uL (ref 0.7–3.1)
Lymphs: 23 %
MCH: 31.6 pg (ref 26.6–33.0)
MCHC: 33.2 g/dL (ref 31.5–35.7)
MCV: 95 fL (ref 79–97)
Monocytes Absolute: 0.5 x10E3/uL (ref 0.1–0.9)
Monocytes: 8 %
Neutrophils Absolute: 4.6 x10E3/uL (ref 1.4–7.0)
Neutrophils: 67 %
Platelets: 197 x10E3/uL (ref 150–450)
RBC: 4.52 x10E6/uL (ref 4.14–5.80)
RDW: 12.6 % (ref 11.6–15.4)
WBC: 6.8 x10E3/uL (ref 3.4–10.8)

## 2024-07-21 LAB — CMP14+EGFR
ALT: 27 IU/L (ref 0–44)
AST: 25 IU/L (ref 0–40)
Albumin: 4.6 g/dL (ref 3.8–4.9)
Alkaline Phosphatase: 53 IU/L (ref 47–123)
BUN/Creatinine Ratio: 16 (ref 9–20)
BUN: 15 mg/dL (ref 6–24)
Bilirubin Total: 0.4 mg/dL (ref 0.0–1.2)
CO2: 24 mmol/L (ref 20–29)
Calcium: 9.6 mg/dL (ref 8.7–10.2)
Chloride: 104 mmol/L (ref 96–106)
Creatinine, Ser: 0.95 mg/dL (ref 0.76–1.27)
Globulin, Total: 2.2 g/dL (ref 1.5–4.5)
Glucose: 79 mg/dL (ref 70–99)
Potassium: 4.3 mmol/L (ref 3.5–5.2)
Sodium: 143 mmol/L (ref 134–144)
Total Protein: 6.8 g/dL (ref 6.0–8.5)
eGFR: 95 mL/min/1.73 (ref >59)

## 2024-07-21 LAB — LIPID PANEL
Chol/HDL Ratio: 3.3 ratio (ref 0.0–5.0)
Cholesterol, Total: 170 mg/dL (ref 100–199)
HDL: 51 mg/dL (ref >39)
LDL Chol Calc (NIH): 94 mg/dL (ref 0–99)
Triglycerides: 145 mg/dL (ref 0–149)
VLDL Cholesterol Cal: 25 mg/dL (ref 5–40)

## 2024-07-25 ENCOUNTER — Ambulatory Visit: Payer: Self-pay | Admitting: Family Medicine

## 2025-01-24 ENCOUNTER — Encounter: Payer: Self-pay | Admitting: Family Medicine
